# Patient Record
Sex: Female | Born: 2000 | Race: White | Hispanic: Yes | Smoking: Never smoker
Health system: Southern US, Community
[De-identification: ages and names within clinical notes are randomized; demographics above are authoritative.]

## PROBLEM LIST (undated history)

## (undated) DIAGNOSIS — I10 Essential (primary) hypertension: Secondary | ICD-10-CM

## (undated) HISTORY — PX: NO PAST SURGERIES: SHX2092

---
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
## 2021-05-14 ENCOUNTER — Inpatient Hospital Stay (HOSPITAL_COMMUNITY): Payer: Self-pay

## 2021-05-14 ENCOUNTER — Encounter (HOSPITAL_COMMUNITY): Payer: Self-pay | Admitting: Obstetrics and Gynecology

## 2021-05-14 ENCOUNTER — Encounter (HOSPITAL_COMMUNITY): Payer: Self-pay | Admitting: Family Medicine

## 2021-05-14 ENCOUNTER — Inpatient Hospital Stay (HOSPITAL_COMMUNITY)
Admission: AD | Admit: 2021-05-14 | Discharge: 2021-05-14 | Disposition: A | Discharge status: Home / Self Care | Payer: Self-pay | Attending: Obstetrics and Gynecology | Admitting: Obstetrics and Gynecology

## 2021-05-14 ENCOUNTER — Inpatient Hospital Stay (HOSPITAL_COMMUNITY)
Admission: AD | Admit: 2021-05-14 | Discharge: 2021-05-14 | Disposition: A | Discharge status: Home / Self Care | Payer: Self-pay | Attending: Family Medicine | Admitting: Family Medicine

## 2021-05-14 ENCOUNTER — Other Ambulatory Visit: Payer: Self-pay

## 2021-05-14 DIAGNOSIS — R109 Unspecified abdominal pain: Secondary | ICD-10-CM | POA: Insufficient documentation

## 2021-05-14 DIAGNOSIS — Z6790 Unspecified blood type, Rh positive: Secondary | ICD-10-CM | POA: Insufficient documentation

## 2021-05-14 DIAGNOSIS — O039 Complete or unspecified spontaneous abortion without complication: Secondary | ICD-10-CM

## 2021-05-14 DIAGNOSIS — O161 Unspecified maternal hypertension, first trimester: Secondary | ICD-10-CM | POA: Insufficient documentation

## 2021-05-14 DIAGNOSIS — O3680X0 Pregnancy with inconclusive fetal viability, not applicable or unspecified: Secondary | ICD-10-CM

## 2021-05-14 DIAGNOSIS — O209 Hemorrhage in early pregnancy, unspecified: Secondary | ICD-10-CM | POA: Insufficient documentation

## 2021-05-14 DIAGNOSIS — Z3A01 Less than 8 weeks gestation of pregnancy: Secondary | ICD-10-CM | POA: Insufficient documentation

## 2021-05-14 HISTORY — DX: Essential (primary) hypertension: I10

## 2021-05-14 LAB — CBC
HCT: 36.2 % (ref 36.0–46.0)
HCT: 37.3 % (ref 36.0–46.0)
Hemoglobin: 12 g/dL (ref 12.0–15.0)
Hemoglobin: 12.2 g/dL (ref 12.0–15.0)
MCH: 27.8 pg (ref 26.0–34.0)
MCH: 27.5 pg (ref 26.0–34.0)
MCHC: 33.1 g/dL (ref 30.0–36.0)
MCHC: 32.7 g/dL (ref 30.0–36.0)
MCV: 84 fL (ref 80.0–100.0)
MCV: 84 fL (ref 80.0–100.0)
Platelets: 223 10*3/uL (ref 150–400)
Platelets: 247 10*3/uL (ref 150–400)
RBC: 4.31 MIL/uL (ref 3.87–5.11)
RBC: 4.44 MIL/uL (ref 3.87–5.11)
RDW: 13.2 % (ref 11.5–15.5)
RDW: 13.2 % (ref 11.5–15.5)
WBC: 8.4 10*3/uL (ref 4.0–10.5)
WBC: 8.7 10*3/uL (ref 4.0–10.5)
nRBC: 0 % (ref 0.0–0.2)
nRBC: 0 % (ref 0.0–0.2)

## 2021-05-14 LAB — ABO/RH: ABO/RH(D): O POS

## 2021-05-14 LAB — HCG, QUANTITATIVE, PREGNANCY
hCG, Beta Chain, Quant, S: 6685 m[IU]/mL — ABNORMAL HIGH (ref <5)
hCG, Beta Chain, Quant, S: 5277 m[IU]/mL — ABNORMAL HIGH (ref <5)

## 2021-05-14 LAB — URINALYSIS, ROUTINE W REFLEX MICROSCOPIC
Bilirubin Urine: NEGATIVE
Glucose, UA: NEGATIVE mg/dL
Ketones, ur: NEGATIVE mg/dL
Nitrite: NEGATIVE
Protein, ur: NEGATIVE mg/dL
Specific Gravity, Urine: 1.013 (ref 1.005–1.030)
pH: 8 (ref 5.0–8.0)

## 2021-05-14 LAB — GC/CHLAMYDIA PROBE AMP (~~LOC~~) NOT AT ARMC
Chlamydia: NEGATIVE
Comment: NEGATIVE
Comment: NORMAL
Neisseria Gonorrhea: NEGATIVE

## 2021-05-14 LAB — HIV ANTIBODY (ROUTINE TESTING W REFLEX): HIV Screen 4th Generation wRfx: NONREACTIVE

## 2021-05-14 LAB — WET PREP, GENITAL
Sperm: NONE SEEN
Trich, Wet Prep: NONE SEEN
Yeast Wet Prep HPF POC: NONE SEEN

## 2021-05-14 NOTE — MAU Provider Note (Signed)
History     CSN: [id number]  Arrival date and time: 05/14/21 1458   Event Date/Time   First Provider Initiated Contact with Patient 05/14/21 1644      Chief Complaint  Patient presents with   Vaginal Bleeding   20 y.o. G1 @[redacted]w[redacted]d  presenting with increased VB. She was seen last night for VB. Reports increased bleeding about 2 hrs ago. She is also passing clots. Reports low abdominal cramping. Rates pain 5/10. Has not treated it.    OB History     Gravida  1   Para      Term      Preterm      AB      Living         SAB      IAB      Ectopic      Multiple      Live Births              Past Medical History:  Diagnosis Date   Hypertension     History reviewed. No pertinent surgical history.  Family History  Problem Relation Age of Onset   Diabetes Mother    Diabetes Father    Diabetes Maternal Grandmother    Diabetes Maternal Grandfather    Diabetes Paternal Grandmother    Diabetes Paternal Grandfather     Social History   Tobacco Use   Smoking status: Never   Smokeless tobacco: Never  Vaping Use   Vaping Use: Former   Substances: Nicotine  Substance Use Topics   Alcohol use: Never   Drug use: Never    Allergies: No Known Allergies  Medications Prior to Admission  Medication Sig Dispense Refill Last Dose   Prenatal Vit-Fe Fumarate-FA (PRENATAL MULTIVITAMIN) TABS tablet Take 1 tablet by mouth daily at 12 noon.       Review of Systems  Gastrointestinal:  Positive for abdominal pain.  Genitourinary:  Positive for vaginal bleeding.  Physical Exam   Blood pressure (!) 114/56, pulse 80, temperature 99.3 F (37.4 C), temperature source Oral, resp. rate 18, height 4\' 11"  (1.499 m), weight 78.3 kg, last menstrual period 03/28/2021, SpO2 99 %.  Physical Exam Vitals and nursing note reviewed. Exam conducted with a chaperone present.  Constitutional:      General: She is not in acute distress.    Appearance: Normal appearance.  HENT:      Head: Normocephalic and atraumatic.  Cardiovascular:     Rate and Rhythm: Normal rate.  Pulmonary:     Effort: Pulmonary effort is normal. No respiratory distress.  Genitourinary:    Comments: VE: small amt bloody discharge, cleared with 3 fox swabs, ?IUGS in vault, removed with ring forceps Musculoskeletal:        General: Normal range of motion.     Cervical back: Normal range of motion.  Skin:    General: Skin is warm and dry.  Neurological:     General: No focal deficit present.     Mental Status: She is alert and oriented to person, place, and time.  Psychiatric:        Mood and Affect: Mood normal.        Behavior: Behavior normal.   Results for orders placed or performed during the hospital encounter of 05/14/21 (from the past 24 hour(s))  CBC     Status: None   Collection Time: 05/14/21  5:08 PM  Result Value Ref Range   WBC 8.7 4.0 - 10.5 K/uL  RBC 4.44 3.87 - 5.11 MIL/uL   Hemoglobin 12.2 12.0 - 15.0 g/dL   HCT 92.4 26.8 - 34.1 %   MCV 84.0 80.0 - 100.0 fL   MCH 27.5 26.0 - 34.0 pg   MCHC 32.7 30.0 - 36.0 g/dL   RDW 96.2 22.9 - 79.8 %   Platelets 247 150 - 400 K/uL   nRBC 0.0 0.0 - 0.2 %   US OB Transvaginal  Result Date: 05/14/2021 CLINICAL DATA:  Worsening vaginal bleeding in 1st trimester pregnancy. EXAM: TRANSVAGINAL OB ULTRASOUND TECHNIQUE: Transvaginal ultrasound was performed for complete evaluation of the gestation as well as the maternal uterus, adnexal regions, and pelvic cul-de-sac. COMPARISON:  Prior today FINDINGS: Intrauterine gestational sac: None, intrauterine gestational sac seen on recent study earlier today is no longer visualized. Maternal uterus/adnexae: No fibroids identified. Right ovary is normal appearance. Left ovary is not directly visualized, however no adnexal mass or abnormal free fluid identified. IMPRESSION: Recently seen IUP is no longer visualized, consistent with spontaneous abortion. Electronically Signed   By: Danae Orleans M.D.    On: 05/14/2021 17:52   US OB LESS THAN 14 WEEKS WITH OB TRANSVAGINAL  Result Date: 05/14/2021 CLINICAL DATA:  Bleeding in pregnancy. Pregnancy of unknown location EXAM: OBSTETRIC <14 WK [place] AND TRANSVAGINAL OB US TECHNIQUE: Both transabdominal and transvaginal ultrasound examinations were performed for complete evaluation of the gestation as well as the maternal uterus, adnexal regions, and pelvic cul-de-sac. Transvaginal technique was performed to assess early pregnancy. COMPARISON:  None. FINDINGS: Intrauterine gestational sac: Single Yolk sac:  Visualized. Embryo:  Visualized. Cardiac Activity: Visualized. Heart Rate: 103 beats per minute- an unremarkable rate for early gestation CRL:  4.3 mm   6 w   1 d                  [place] EDC: 01/06/2022 Subchorionic hemorrhage:  None visualized. Maternal uterus/adnexae: Normal. IMPRESSION: Single living intrauterine pregnancy measuring 6 weeks 1 day. Electronically Signed   By: Marnee Spring M.D.   On: 05/14/2021 05:04    MAU Course  Procedures  MDM Declines pain meds. Labs and [place] ordered and reviewed. SAB confirmed by [place]. Pt informed, condolences given. Bleeding remains minimal. POCs to path. Stable for discharge home.   Assessment and Plan   1. SAB (spontaneous abortion)   2. Blood type, Rh positive    Discharge home Follow up at [facility] in 2 weeks- message sent Pelvic rest Return precautions Tylenol/Ibuprofen prn  Allergies as of 05/14/2021   No Known Allergies      Medication List     TAKE these medications    prenatal multivitamin Tabs tablet Take 1 tablet by mouth daily at 12 noon.       Donette Larry, CNM 05/14/2021, 6:28 PM

## 2021-05-14 NOTE — MAU Provider Note (Signed)
Chief Complaint: Vaginal Bleeding   Event Date/Time   First Provider Initiated Contact with Patient 05/14/21 0305        SUBJECTIVE HPI: Virginia Burke is a 20 y.o. G1P0 at [redacted]w[redacted]d by LMP who presents to maternity admissions reporting light bleeding and no cramping.  Has been to HD for pregnancy test but has not had visits yet. . She denies vaginal itching/burning, urinary symptoms, h/a, dizziness, n/v, or fever/chills.    Vaginal Bleeding The patient's primary symptoms include vaginal bleeding. The patient's pertinent negatives include no genital itching, genital lesions, genital odor or pelvic pain. This is a new problem. The current episode started today. The problem has been gradually improving. The patient is experiencing no pain. She is pregnant. Pertinent negatives include no abdominal pain, back pain, chills, constipation, diarrhea, dysuria, fever or frequency. The vaginal discharge was bloody. The vaginal bleeding is spotting. She has not been passing clots. She has not been passing tissue. Nothing aggravates the symptoms. She has tried nothing for the symptoms.   RN Note: Pt c/o spotting today. Recent positive pregnancy test at health department, LMP 03/28/21. No pain  Past Medical History:  Diagnosis Date   Hypertension    History reviewed. No pertinent surgical history. Social History   Socioeconomic History   Marital status: Significant Other    Spouse name: Not on file   Number of children: Not on file   Years of education: Not on file   Highest education level: Not on file  Occupational History   Not on file  Tobacco Use   Smoking status: Never   Smokeless tobacco: Never  Vaping Use   Vaping Use: Former   Substances: Nicotine  Substance and Sexual Activity   Alcohol use: Never   Drug use: Never   Sexual activity: Not Currently  Other Topics Concern   Not on file  Social History Narrative   Not on file   Social Determinants of Health   Financial  Resource Strain: Not on file  Food Insecurity: Not on file  Transportation Needs: Not on file  Physical Activity: Not on file  Stress: Not on file  Social Connections: Not on file  Intimate Partner Violence: Not on file   No current facility-administered medications on file prior to encounter.   Current Outpatient Medications on File Prior to Encounter  Medication Sig Dispense Refill   Prenatal Vit-Fe Fumarate-FA (PRENATAL MULTIVITAMIN) TABS tablet Take 1 tablet by mouth daily at 12 noon.     acetaminophen-codeine 120-12 MG/5ML suspension Take 5 mLs by mouth every 6 (six) hours as needed for pain.     amoxicillin (AMOXIL) 250 MG/5ML suspension Take by mouth 3 (three) times daily.     No Known Allergies  I have reviewed patient's Past Medical Hx, Surgical Hx, Family Hx, Social Hx, medications and allergies.   ROS:  Review of Systems  Constitutional:  Negative for chills and fever.  Gastrointestinal:  Negative for abdominal pain, constipation and diarrhea.  Genitourinary:  Positive for vaginal bleeding. Negative for dysuria, frequency and pelvic pain.  Musculoskeletal:  Negative for back pain.  Review of Systems  Other systems negative   Physical Exam  Physical Exam Patient Vitals for the past 24 hrs:  BP Temp Temp src Pulse Resp SpO2 Height Weight  05/14/21 0215 112/60 98.2 F (36.8 C) Oral 77 16 99 % 4\' 11"  (1.499 m) 79.2 kg   Constitutional: Well-developed, well-nourished female in no acute distress.  Cardiovascular: normal rate Respiratory: normal effort  GI: Abd soft, non-tender. Pos BS x 4 MS: Extremities nontender, no edema, normal ROM Neurologic: Alert and oriented x 4.  GU: Neg CVAT.  PELVIC EXAM: Cervix pink, visually closed, without lesion, scant white creamy discharge, vaginal walls and external genitalia normal Bimanual exam: Cervix 0/long/high, firm, anterior, neg CMT, uterus nontender, slightly enlarged, adnexa without tenderness, enlargement, or  mass Chaperone present for exam   LAB RESULTS Results for orders placed or performed during the hospital encounter of 05/14/21 (from the past 24 hour(s))  CBC     Status: None   Collection Time: 05/14/21  2:46 AM  Result Value Ref Range   WBC 8.4 4.0 - 10.5 K/uL   RBC 4.31 3.87 - 5.11 MIL/uL   Hemoglobin 12.0 12.0 - 15.0 g/dL   HCT 81.8 29.9 - 37.1 %   MCV 84.0 80.0 - 100.0 fL   MCH 27.8 26.0 - 34.0 pg   MCHC 33.1 30.0 - 36.0 g/dL   RDW 69.6 78.9 - 38.1 %   Platelets 223 150 - 400 K/uL   nRBC 0.0 0.0 - 0.2 %  hCG, quantitative, pregnancy     Status: Abnormal   Collection Time: 05/14/21  2:46 AM  Result Value Ref Range   hCG, Beta Chain, Quant, S 6,685 (H) <5 mIU/mL  Urinalysis, Routine w reflex microscopic Urine, Clean Catch     Status: Abnormal   Collection Time: 05/14/21  3:08 AM  Result Value Ref Range   Color, Urine AMBER (A) YELLOW   APPearance CLOUDY (A) CLEAR   Specific Gravity, Urine 1.013 1.005 - 1.030   pH 8.0 5.0 - 8.0   Glucose, UA NEGATIVE NEGATIVE mg/dL   Hgb urine dipstick MODERATE (A) NEGATIVE   Bilirubin Urine NEGATIVE NEGATIVE   Ketones, ur NEGATIVE NEGATIVE mg/dL   Protein, ur NEGATIVE NEGATIVE mg/dL   Nitrite NEGATIVE NEGATIVE   Leukocytes,Ua SMALL (A) NEGATIVE   RBC / HPF 0-5 0 - 5 RBC/hpf   WBC, UA 21-50 0 - 5 WBC/hpf   Bacteria, UA RARE (A) NONE SEEN   Squamous Epithelial / LPF 6-10 0 - 5   Amorphous Crystal PRESENT   Wet prep, genital     Status: Abnormal   Collection Time: 05/14/21  3:18 AM   Specimen: Vaginal  Result Value Ref Range   Yeast Wet Prep HPF POC NONE SEEN NONE SEEN   Trich, Wet Prep NONE SEEN NONE SEEN   Clue Cells Wet Prep HPF POC PRESENT (A) NONE SEEN   WBC, Wet Prep HPF POC MANY (A) NONE SEEN   Sperm NONE SEEN   ABO/Rh     Status: None   Collection Time: 05/14/21  3:25 AM  Result Value Ref Range   ABO/RH(D)      O POS Performed at [facility], [address] [place], [place] [address]      IMAGING US OB  LESS THAN 14 WEEKS WITH OB TRANSVAGINAL  Result Date: 05/14/2021 CLINICAL DATA:  Bleeding in pregnancy. Pregnancy of unknown location EXAM: OBSTETRIC <14 WK [place] AND TRANSVAGINAL OB US TECHNIQUE: Both transabdominal and transvaginal ultrasound examinations were performed for complete evaluation of the gestation as well as the maternal uterus, adnexal regions, and pelvic cul-de-sac. Transvaginal technique was performed to assess early pregnancy. COMPARISON:  None. FINDINGS: Intrauterine gestational sac: Single Yolk sac:  Visualized. Embryo:  Visualized. Cardiac Activity: Visualized. Heart Rate: 103 beats per minute- an unremarkable rate for early gestation CRL:  4.3 mm   6 w  1 d                  [place] EDC: 01/06/2022 Subchorionic hemorrhage:  None visualized. Maternal uterus/adnexae: Normal. IMPRESSION: Single living intrauterine pregnancy measuring 6 weeks 1 day. Electronically Signed   By: Marnee Spring M.D.   On: 05/14/2021 05:04     MAU Management/MDM: Ordered usual first trimester r/o ectopic labs.   Pelvic exam and cultures done Will check baseline Ultrasound to rule out ectopic.  This bleeding/pain can represent a normal pregnancy with bleeding, spontaneous abortion or even an ectopic which can be life-threatening.  The process as listed above helps to determine which of these is present.  Reviewed results Discussed seeing live fetus with no obvious source of bleeding, but this is likely implantation or small [facility]  Recommend pelvic rest for 2 wks   ASSESSMENT Pregnancy of unknown location Bleeding in first trimester Single live IUP seen on [place]  PLAN Discharge home List of OB providers given  Pt stable at time of discharge. Encouraged to return here if she develops worsening of symptoms, increase in pain, fever, or other concerning symptoms.    Wynelle Bourgeois CNM, MSN Certified Nurse-Midwife 05/14/2021  3:05 AM

## 2021-05-14 NOTE — MAU Note (Addendum)
Pt came in be EMS due to increase in bleeding. Came yesterday(early this morning) for spotting, now is heavier and passing blood clots. Cramping in lower abd. Does not have pad on "ambulance came, so she couldn't go change, underwear is full of blood". (Small amt per picture)

## 2021-05-14 NOTE — MAU Note (Signed)
Pt c/o spotting today. Recent positive pregnancy test at health department, LMP 03/28/21. No pain

## 2021-05-14 NOTE — Discharge Instructions (Signed)
[facility] Prov[facility]   [facility]             [address], [place], [place] [address] [phone]  [facility]                                                             [address], [address], [place], [place], [address] [phone]  [facility]                                    [address], [address], [place], [place], [address] [phone]  [facility] [place] [address], [place], [place], [address] [phone]  [facility]                                 [address], [place], [place], [address] [phone]  [facility]                                    [address], [place], [place], [address] [phone]  [facility] [address], [address], [place], [place], [address]                              [facility] [address], [place], , [address] [phone]  [facility]         Phone: [phone]  [facility]      Phone: [phone]   [facility]      Phone: [phone]  [facility]         Phone: [phone]   [facility]    Phone: [phone]  [facility]      Phone: [phone]  [facility]     Phone: [phone]  [facility]      Phone: [phone]  

## 2021-05-15 ENCOUNTER — Telehealth: Payer: Self-pay | Admitting: Radiology

## 2021-05-15 NOTE — Telephone Encounter (Signed)
Called patient to schedule appointment at [facility] for SAB in two weeks from MAU visit 05/14/21. Unable to leave voicemail.

## 2021-05-18 LAB — SURGICAL PATHOLOGY

## 2021-06-07 ENCOUNTER — Ambulatory Visit: Payer: Self-pay | Admitting: Obstetrics & Gynecology

## 2022-03-17 IMAGING — US US OB TRANSVAGINAL
1 series · 15 of 24 positions shown · non-contrast
Comparison: Prior today

CLINICAL DATA: Worsening vaginal bleeding in 1st trimester
pregnancy.

EXAM:
TRANSVAGINAL OB ULTRASOUND
TECHNIQUE: Transvaginal ultrasound was performed for complete evaluation of the
gestation as well as the maternal uterus, adnexal regions, and
pelvic cul-de-sac.

[Series 1: us ob transvaginal · 24 acquisitions, 15 frames shown]
[im 1/24]
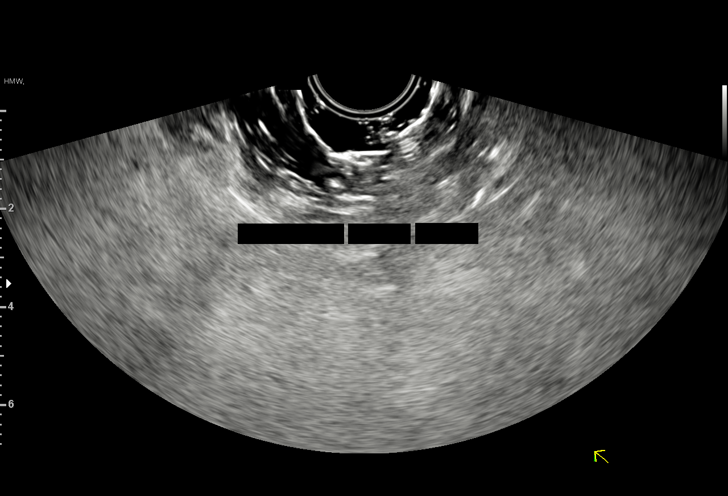
[im 3/24]
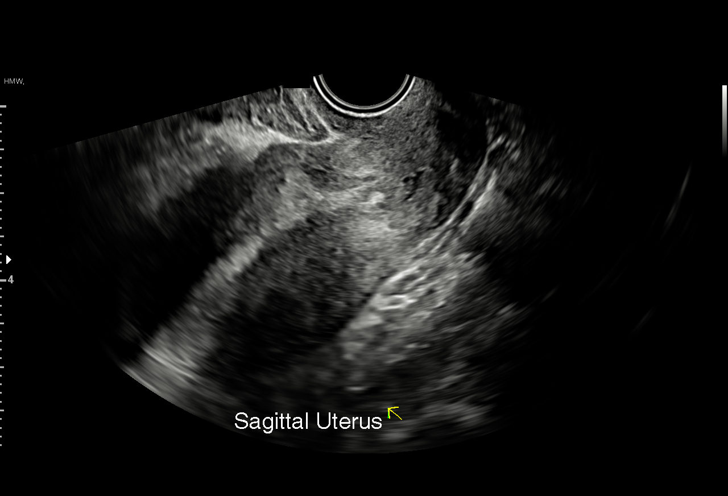
[im 5/24]
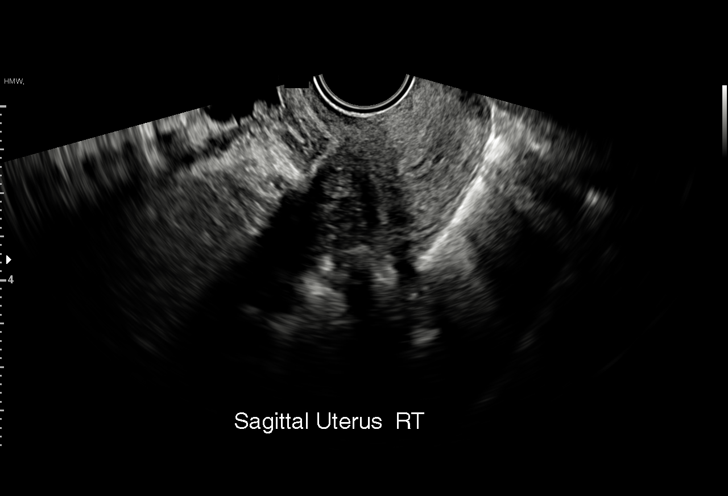
[im 6/24]
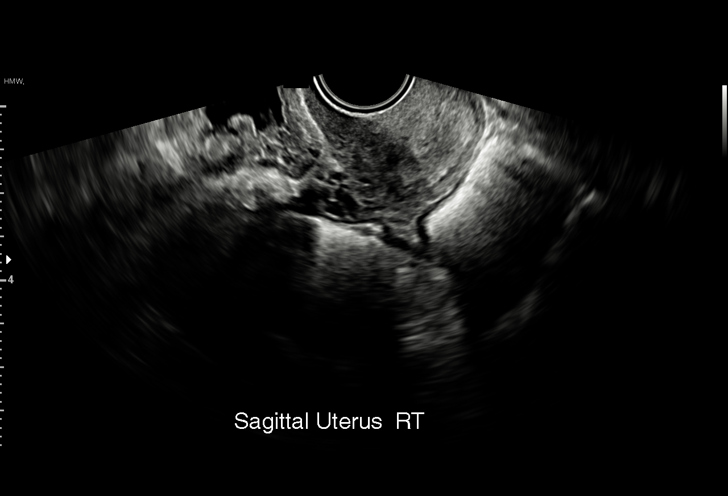
[im 8/24]
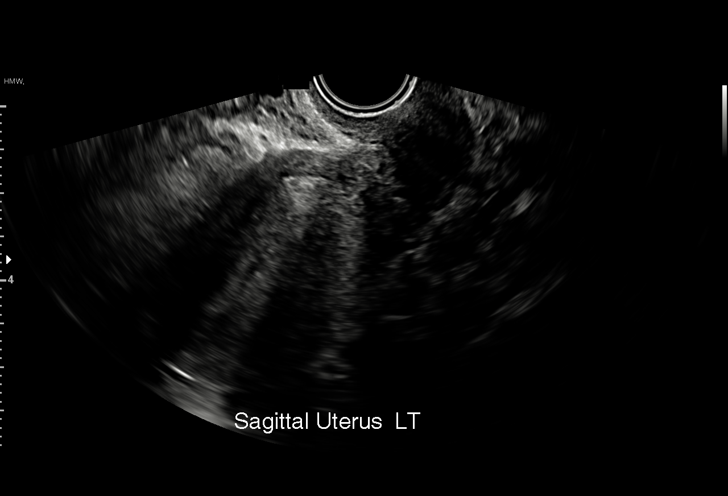
[im 9/24]
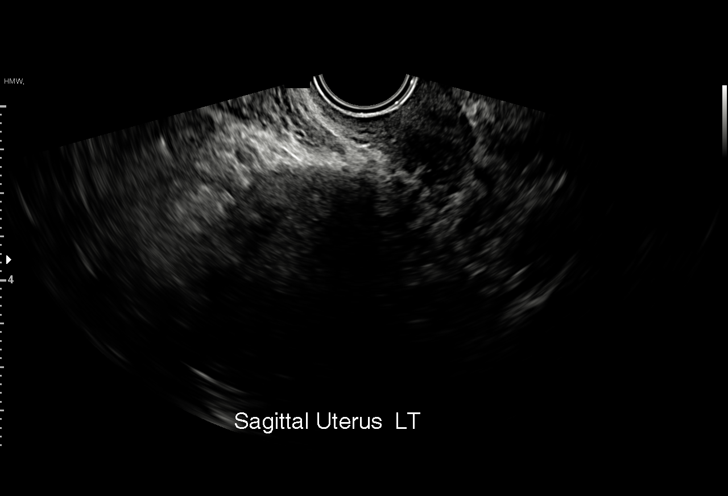
[im 11/24]
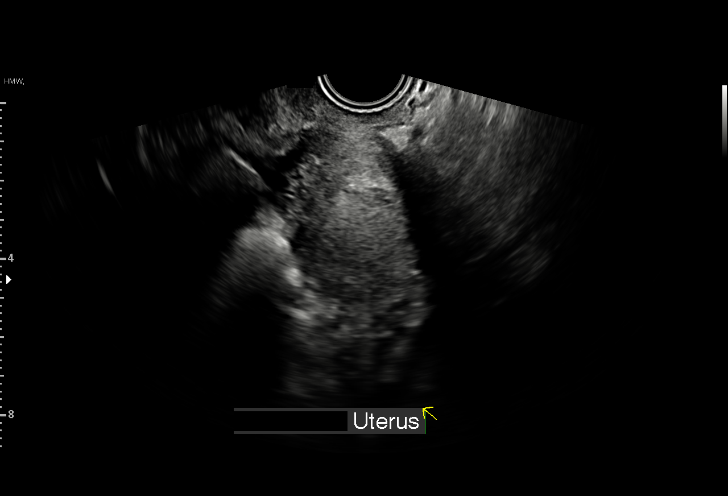
[im 13/24]
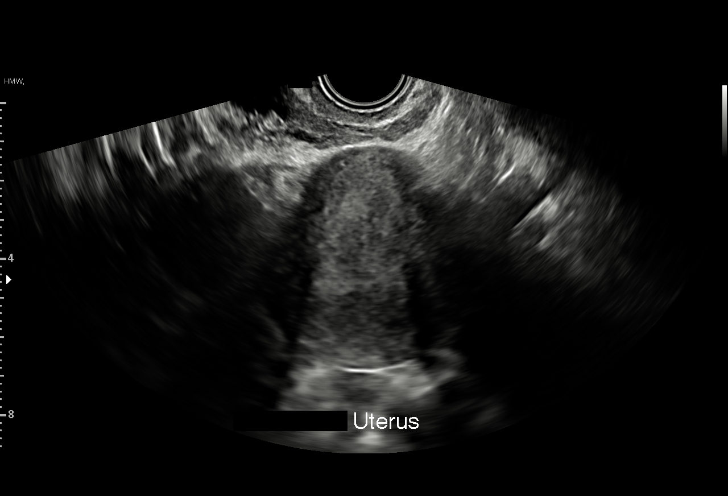
[im 14/24]
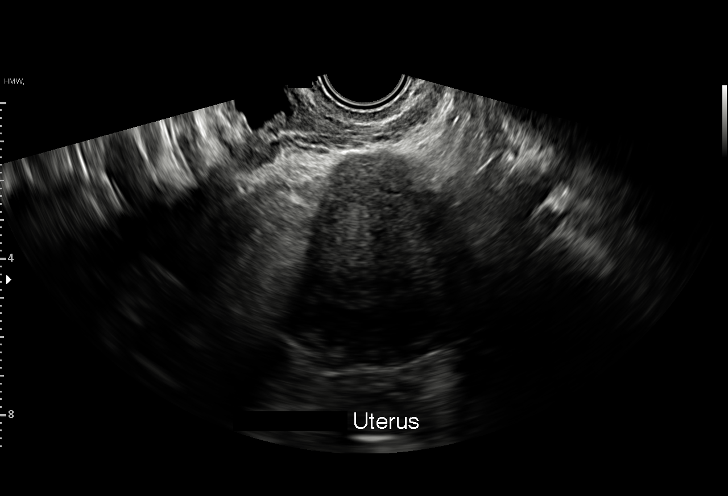
[im 16/24]
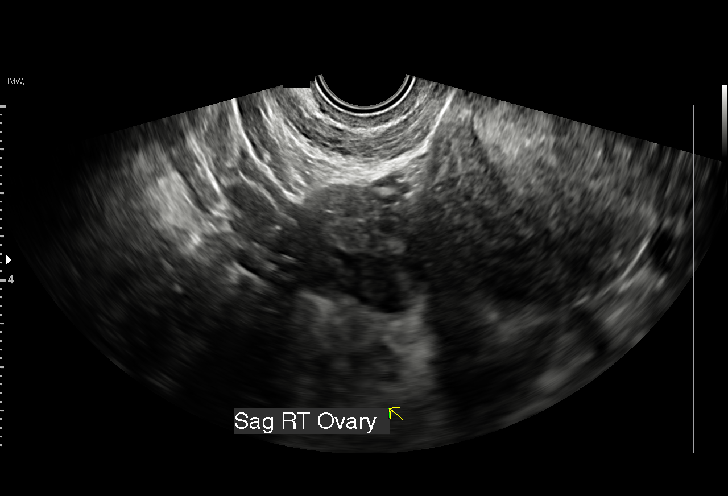
[im 17/24]
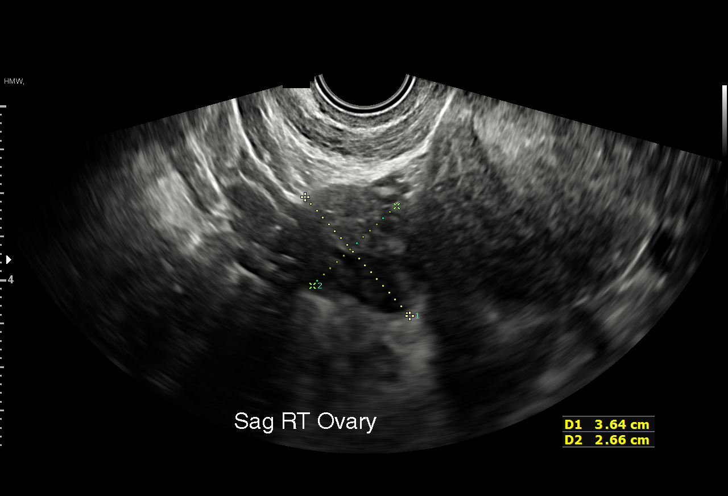
[im 19/24]
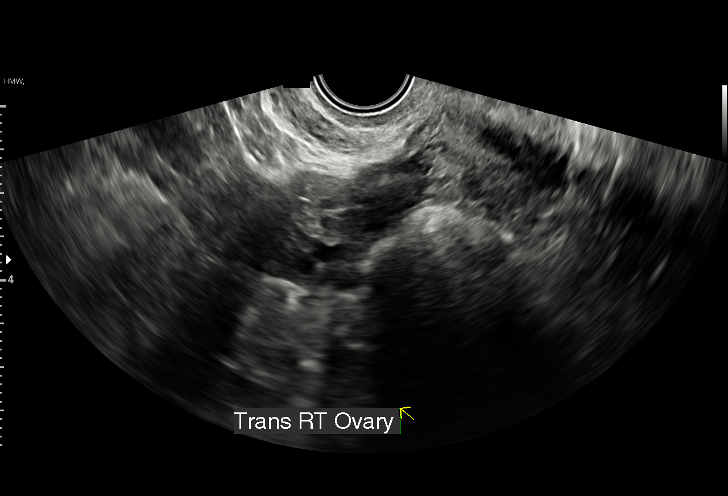
[im 21/24]
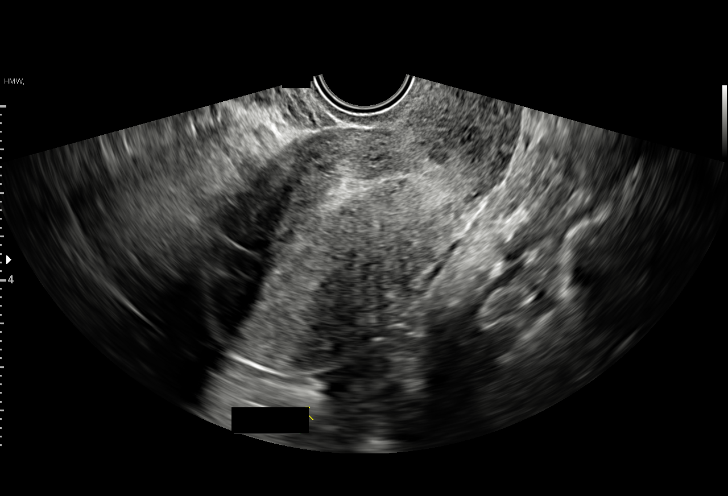
[im 22/24]
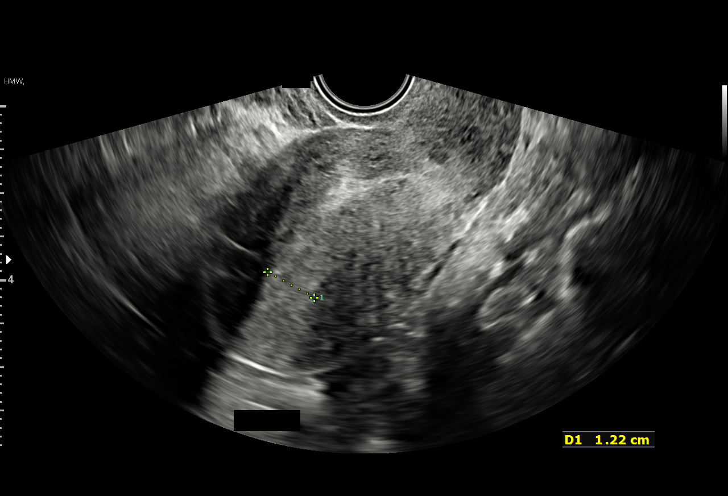
[im 24/24]
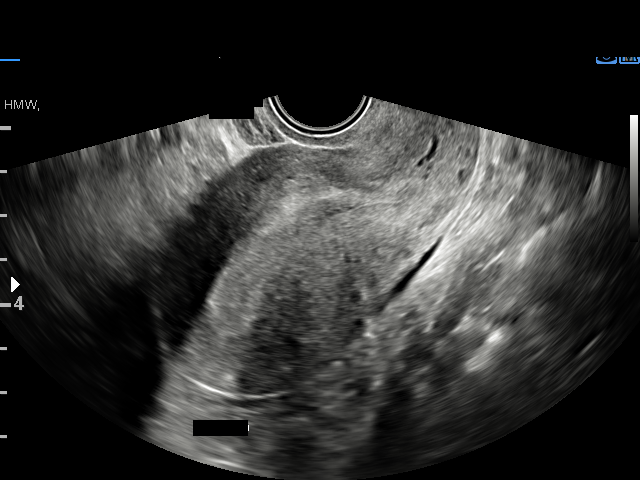

[15 of 24 positions shown; findings below may reference images not displayed]

FINDINGS: Intrauterine gestational sac: None, intrauterine gestational sac
seen on recent study earlier today is no longer visualized.

Maternal uterus/adnexae: No fibroids identified. Right ovary is
normal appearance. Left ovary is not directly visualized, however no
adnexal mass or abnormal free fluid identified.
IMPRESSION: Recently seen IUP is no longer visualized, consistent with
spontaneous abortion.

## 2022-03-17 IMAGING — US US OB < 14 WEEKS - US OB TV
1 series · 15 of 28 positions shown · non-contrast
Comparison: None.

CLINICAL DATA: Bleeding in pregnancy. Pregnancy of unknown location

EXAM:
OBSTETRIC <14 WK US AND TRANSVAGINAL OB US
TECHNIQUE: Both transabdominal and transvaginal ultrasound examinations were
performed for complete evaluation of the gestation as well as the
maternal uterus, adnexal regions, and pelvic cul-de-sac.
Transvaginal technique was performed to assess early pregnancy.

[Series 1: us ob < 14 weeks - us ob tv · 15 of 64 slices shown]
[im 1/64]
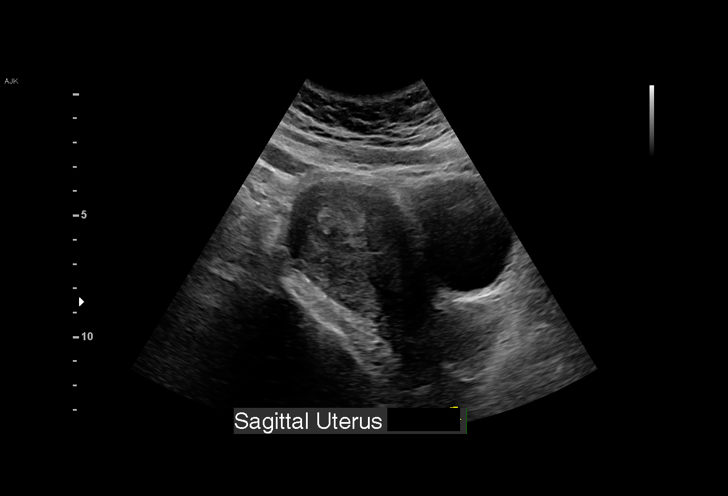
[im 5/64]
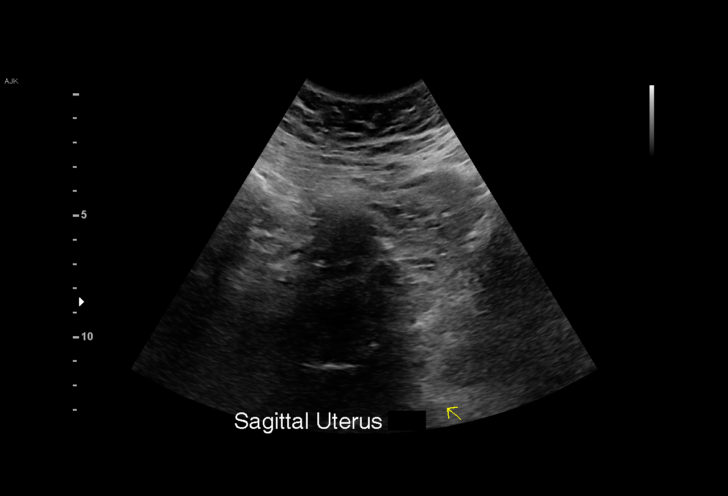
[im 10/64]
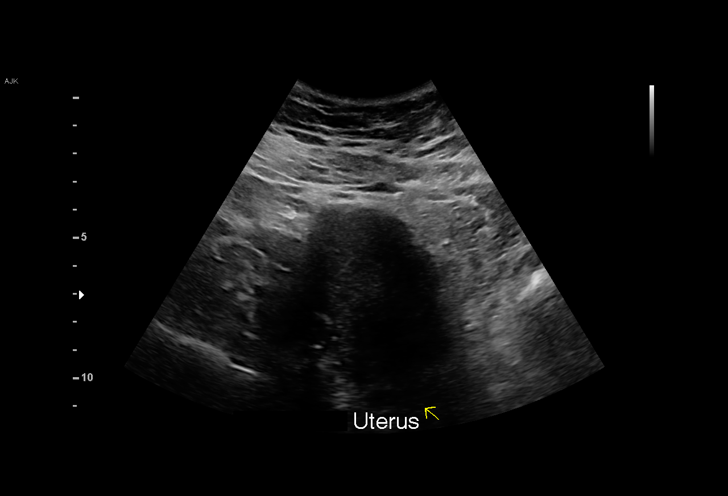
[im 15/64]
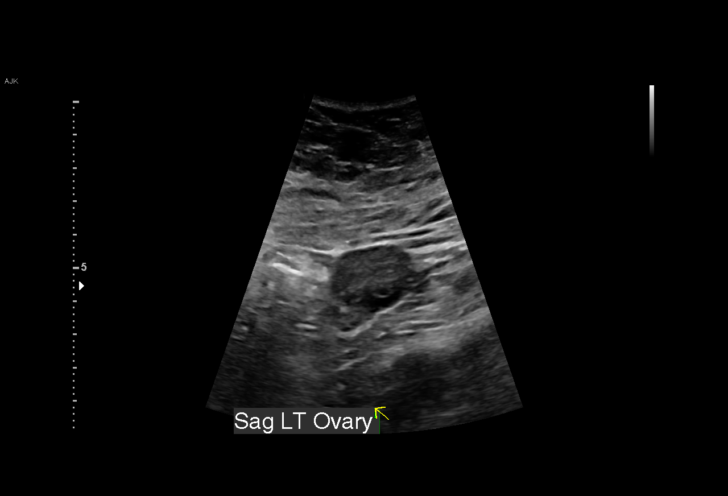
[im 19/64]
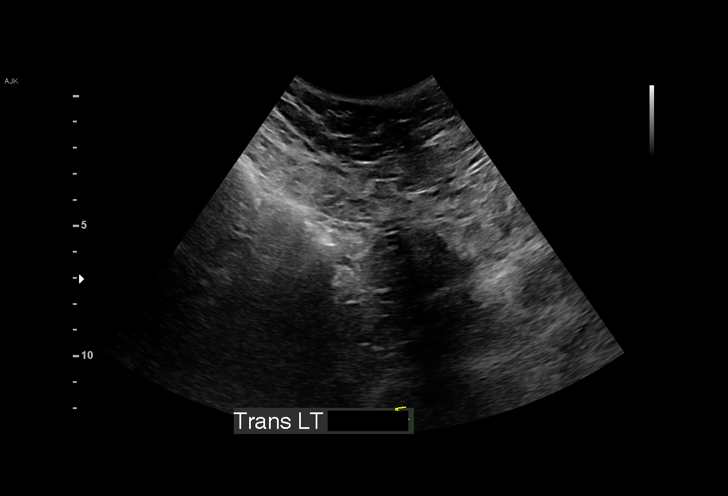
[im 24/64]
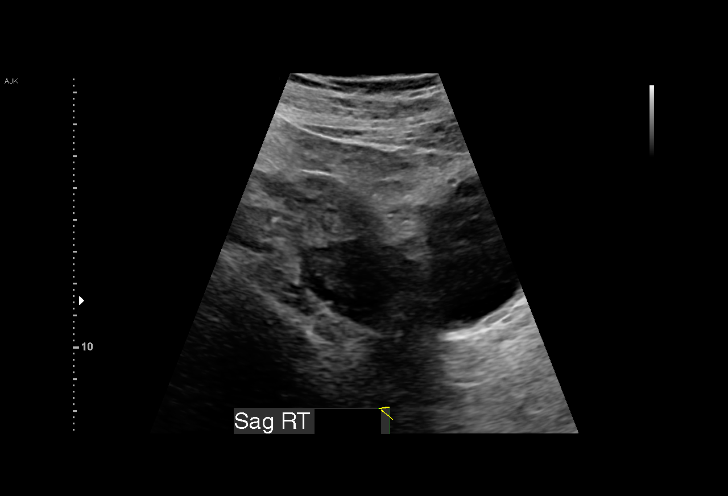
[im 29/64]
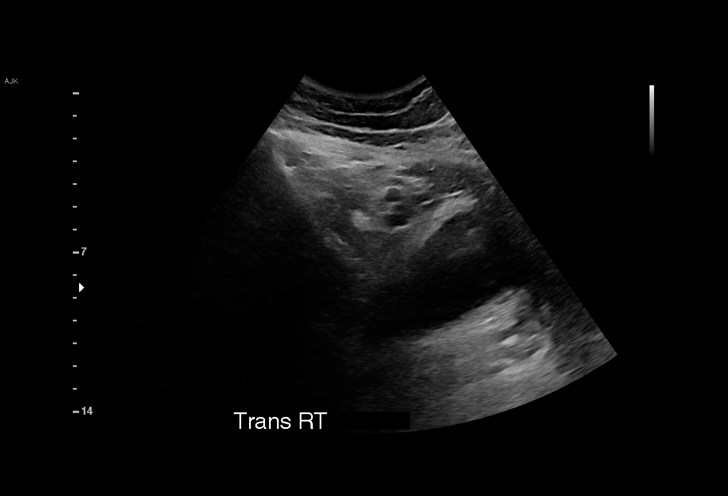
[im 33/64]
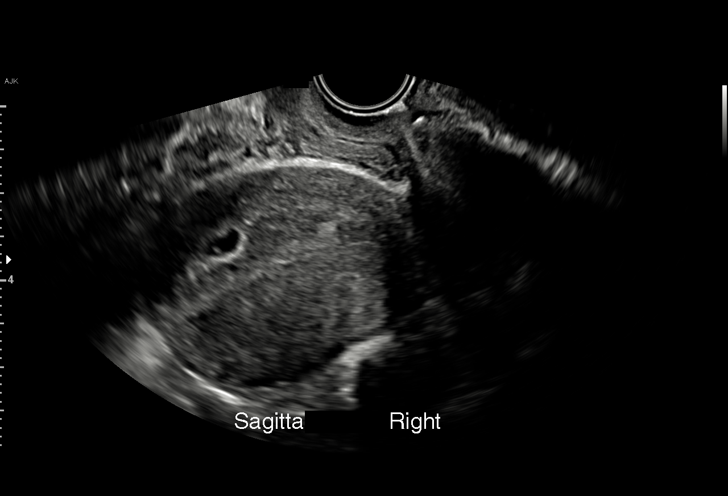
[im 36/64]
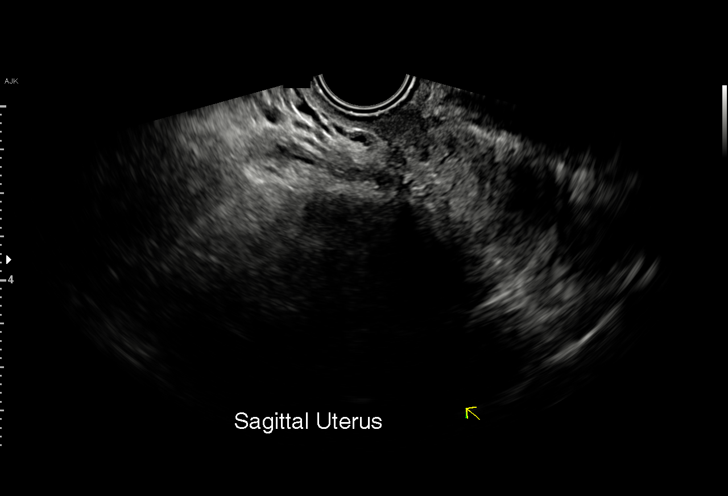
[im 40/64]
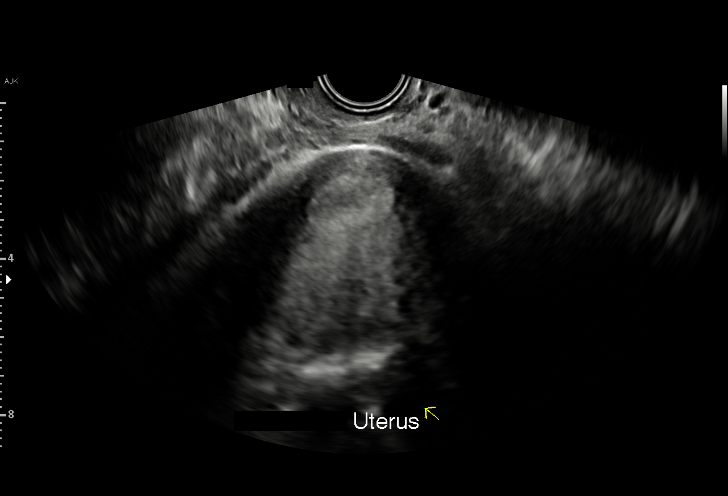
[im 45/64]
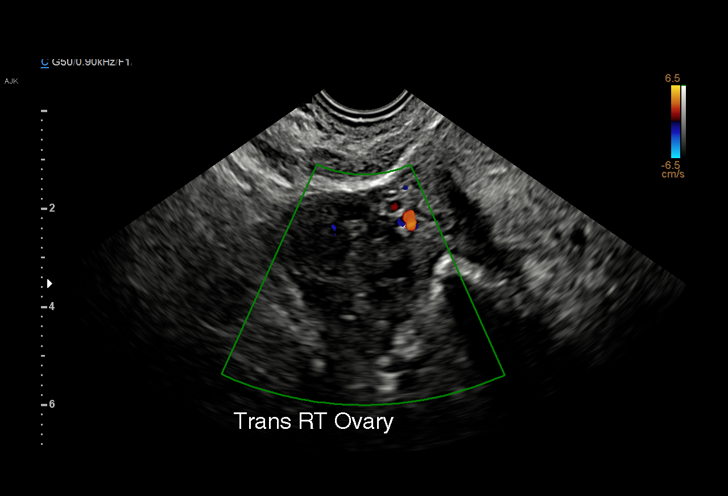
[im 50/64]
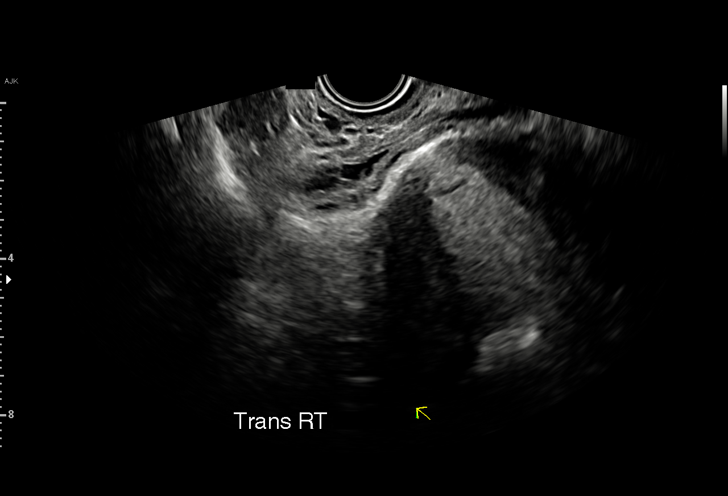
[im 54/64]
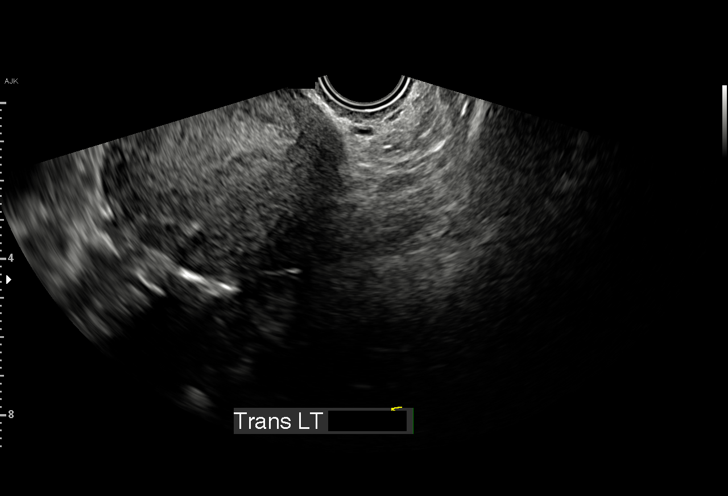
[im 59/64]
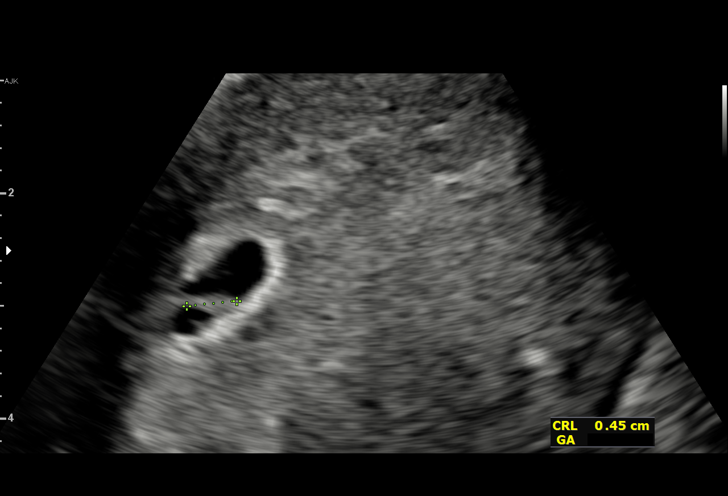
[im 64/64]
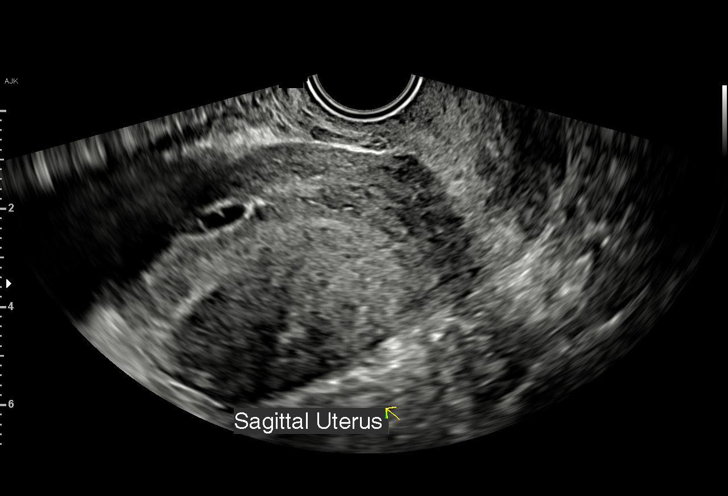

[15 of 28 positions shown; findings below may reference images not displayed]

FINDINGS: Intrauterine gestational sac: Single

Yolk sac:  Visualized.

Embryo:  Visualized.

Cardiac Activity: Visualized.

Heart Rate: 103 beats per minute- an unremarkable rate for early
gestation

CRL:  4.3 mm   6 w   1 d                  US EDC: 01/06/2022

Subchorionic hemorrhage:  None visualized.

Maternal uterus/adnexae: Normal.
IMPRESSION: Single living intrauterine pregnancy measuring 6 weeks 1 day.

## 2022-09-08 ENCOUNTER — Encounter (HOSPITAL_COMMUNITY): Payer: Self-pay

## 2022-09-08 ENCOUNTER — Inpatient Hospital Stay (HOSPITAL_COMMUNITY): Payer: Self-pay

## 2022-09-08 ENCOUNTER — Inpatient Hospital Stay (HOSPITAL_COMMUNITY)
Admission: AD | Admit: 2022-09-08 | Discharge: 2022-09-08 | Disposition: A | Discharge status: Home / Self Care | Payer: Self-pay | Attending: Obstetrics & Gynecology | Admitting: Obstetrics & Gynecology

## 2022-09-08 DIAGNOSIS — O209 Hemorrhage in early pregnancy, unspecified: Secondary | ICD-10-CM | POA: Insufficient documentation

## 2022-09-08 DIAGNOSIS — O021 Missed abortion: Secondary | ICD-10-CM

## 2022-09-08 DIAGNOSIS — Z3A10 10 weeks gestation of pregnancy: Secondary | ICD-10-CM | POA: Insufficient documentation

## 2022-09-08 LAB — CBC
HCT: 37.5 % (ref 36.0–46.0)
Hemoglobin: 12.3 g/dL (ref 12.0–15.0)
MCH: 27.1 pg (ref 26.0–34.0)
MCHC: 32.8 g/dL (ref 30.0–36.0)
MCV: 82.6 fL (ref 80.0–100.0)
Platelets: 241 10*3/uL (ref 150–400)
RBC: 4.54 MIL/uL (ref 3.87–5.11)
RDW: 13.1 % (ref 11.5–15.5)
WBC: 9.9 10*3/uL (ref 4.0–10.5)
nRBC: 0 % (ref 0.0–0.2)

## 2022-09-08 LAB — COMPREHENSIVE METABOLIC PANEL
ALT: 18 U/L (ref 0–44)
AST: 16 U/L (ref 15–41)
Albumin: 3.6 g/dL (ref 3.5–5.0)
Alkaline Phosphatase: 70 U/L (ref 38–126)
Anion gap: 8 (ref 5–15)
BUN: 5 mg/dL — ABNORMAL LOW (ref 6–20)
CO2: 22 mmol/L (ref 22–32)
Calcium: 9.3 mg/dL (ref 8.9–10.3)
Chloride: 108 mmol/L (ref 98–111)
Creatinine, Ser: 0.59 mg/dL (ref 0.44–1.00)
GFR, Estimated: >60 mL/min (ref >60)
Glucose, Bld: 101 mg/dL — ABNORMAL HIGH (ref 70–99)
Potassium: 4 mmol/L (ref 3.5–5.1)
Sodium: 138 mmol/L (ref 135–145)
Total Bilirubin: 0.3 mg/dL (ref 0.3–1.2)
Total Protein: 7.2 g/dL (ref 6.5–8.1)

## 2022-09-08 LAB — WET PREP, GENITAL
Sperm: NONE SEEN
Trich, Wet Prep: NONE SEEN
WBC, Wet Prep HPF POC: >=10 — AB (ref <10)
Yeast Wet Prep HPF POC: NONE SEEN

## 2022-09-08 LAB — GC/CHLAMYDIA PROBE AMP (~~LOC~~) NOT AT ARMC
Chlamydia: NEGATIVE
Comment: NEGATIVE
Comment: NORMAL
Neisseria Gonorrhea: NEGATIVE

## 2022-09-08 LAB — POCT PREGNANCY, URINE: Preg Test, Ur: POSITIVE — AB

## 2022-09-08 LAB — HCG, QUANTITATIVE, PREGNANCY: hCG, Beta Chain, Quant, S: 25633 m[IU]/mL — ABNORMAL HIGH (ref <5)

## 2022-09-08 NOTE — Discharge Instructions (Addendum)
There are three options for managing a miscarriage: 1) Expectant Management: This is where you wait for 2 weeks to see if the body passes the pregnancy on its own 2) Cytotec (Misoprostol): this is a medication that causes cramping of the uterus and can help passage of the pregnancy.  3) Dilation and curettage: this is a surgical procedure to remove the pregnancy from the uterus   We will see you in 2 weeks to confirm passage of the pregnancy or to help you with passage of your pregnancy. You were given a prescription for 3 medications. Two were sent to your pharmacy (Ibuprofen which is for pain and zofran which is for nausea). You were given a paper prescription for Percocet which is a strong pain medication you can use for severe cramping.  If you decide you would like to have Cytotec please call the office and we will send this to the pharmacy for you.    We will see you in 2 weeks to confirm passage of the pregnancy or to help you with passage of your pregnancy. You were given a prescription for 3 medications. Two were sent to your pharmacy (Ibuprofen which is for pain and Zofran which is for nausea). You were also sent in a limited prescription for Percocet for severe pain.  In case you decide to proceed with Cytotec usage that prescription has been sent as well.  If you have any problems with obtaining the medication, at the pharmacy, please call the office.   You need to call or go to the emergency room for: -bleeding that fills up 1 pad per hour -Severe abdominal pain -Dizziness/lightheadedness -passing out Or any medical concern

## 2022-09-08 NOTE — MAU Note (Signed)
.  Virginia Burke is a 21 y.o. at [redacted]w[redacted]d here in MAU reporting: intermittent light pink VB spotting with wiping for last 3 days. Pt denies ABD pain. Pt currently not wearing a pad. Pt denies PNC yet, first appt Tuesday. Pt denies LOF or clotting.  No recent intercourse LMP: 06/30/2022 Onset of complaint: 3 days Pain score: 0/10 Vitals:   09/08/22 0213  BP: 119/61  Pulse: 88  Resp: 18  Temp: 98.8 F (37.1 C)  SpO2: 100%     FHT:RN attempted to assess Lab orders placed from triage:

## 2022-09-08 NOTE — MAU Provider Note (Signed)
History     CSN: [id number]  Arrival date and time: 09/08/22 0048   Event Date/Time   First Provider Initiated Contact with Patient 09/08/22 [phone]      Chief Complaint  Patient presents with   Vaginal Bleeding   HPI Virginia Burke is a 21 y.o. G2P0 at [redacted]w[redacted]d by Unsure Jun 30, 2022 who plans to receive care at [facility] .  She presents today for Vaginal Bleeding.  She reports some occasional light pink spotting, with wiping, for the past 3 days. She denies discharge prior to the spotting. She states she notices the spotting with ambulation/activity, but when she stays in bed it resides.    OB History     Gravida  2   Para      Term      Preterm      AB      Living         SAB      IAB      Ectopic      Multiple      Live Births              Past Medical History:  Diagnosis Date   Hypertension     No past surgical history on file.  Family History  Problem Relation Age of Onset   Diabetes Mother    Diabetes Father    Diabetes Maternal Grandmother    Diabetes Maternal Grandfather    Diabetes Paternal Grandmother    Diabetes Paternal Grandfather     Social History   Tobacco Use   Smoking status: Never   Smokeless tobacco: Never  Vaping Use   Vaping Use: Former   Substances: Nicotine  Substance Use Topics   Alcohol use: Never   Drug use: Never    Allergies: No Known Allergies  Medications Prior to Admission  Medication Sig Dispense Refill Last Dose   Prenatal Vit-Fe Fumarate-FA (PRENATAL MULTIVITAMIN) TABS tablet Take 1 tablet by mouth daily at 12 noon.       Review of Systems  Constitutional:  Negative for chills and fever.  Gastrointestinal:  Positive for constipation (2 Days ago, hard to pass) and nausea. Negative for abdominal pain, diarrhea and vomiting.  Genitourinary:  Positive for vaginal bleeding. Negative for difficulty urinating, dysuria and vaginal discharge.  Neurological:  Positive for headaches (Left Side 3/10).  Negative for dizziness and light-headedness.   Physical Exam   Blood pressure 119/61, pulse 88, temperature 98.8 F (37.1 C), temperature source Oral, resp. rate 18, height 4\' 9"  (1.448 m), weight 78.6 kg, last menstrual period 06/30/2022, SpO2 100 %, unknown if currently breastfeeding.  Physical Exam Vitals reviewed.  Constitutional:      Appearance: Normal appearance.  HENT:     Head: Normocephalic and atraumatic.  Eyes:     Conjunctiva/sclera: Conjunctivae normal.  Cardiovascular:     Rate and Rhythm: Normal rate.  Pulmonary:     Effort: Pulmonary effort is normal. No respiratory distress.  Abdominal:     General: Bowel sounds are normal.  Musculoskeletal:     Cervical back: Normal range of motion.  Skin:    General: Skin is warm and dry.  Neurological:     Mental Status: She is alert and oriented to person, place, and time.  Psychiatric:        Mood and Affect: Mood normal.        Behavior: Behavior normal.     MAU Course  Procedures Results for orders placed  or performed during the hospital encounter of 09/08/22 (from the past 24 hour(s))  Pregnancy, urine POC     Status: Abnormal   Collection Time: 09/08/22  1:22 AM  Result Value Ref Range   Preg Test, Ur POSITIVE (A) NEGATIVE  CBC     Status: None   Collection Time: 09/08/22  2:08 AM  Result Value Ref Range   WBC 9.9 4.0 - 10.5 K/uL   RBC 4.54 3.87 - 5.11 MIL/uL   Hemoglobin 12.3 12.0 - 15.0 g/dL   HCT 01.0 27.2 - 53.6 %   MCV 82.6 80.0 - 100.0 fL   MCH 27.1 26.0 - 34.0 pg   MCHC 32.8 30.0 - 36.0 g/dL   RDW 64.4 03.4 - 74.2 %   Platelets 241 150 - 400 K/uL   nRBC 0.0 0.0 - 0.2 %  hCG, quantitative, pregnancy     Status: Abnormal   Collection Time: 09/08/22  2:08 AM  Result Value Ref Range   hCG, Beta Chain, Quant, S 25,633 (H) <5 mIU/mL  Comprehensive metabolic panel     Status: Abnormal   Collection Time: 09/08/22  2:08 AM  Result Value Ref Range   Sodium 138 135 - 145 mmol/L   Potassium 4.0 3.5 -  5.1 mmol/L   Chloride 108 98 - 111 mmol/L   CO2 22 22 - 32 mmol/L   Glucose, Bld 101 (H) 70 - 99 mg/dL   BUN 5 (L) 6 - 20 mg/dL   Creatinine, Ser 5.95 0.44 - 1.00 mg/dL   Calcium 9.3 8.9 - 63.8 mg/dL   Total Protein 7.2 6.5 - 8.1 g/dL   Albumin 3.6 3.5 - 5.0 g/dL   AST 16 15 - 41 U/L   ALT 18 0 - 44 U/L   Alkaline Phosphatase 70 38 - 126 U/L   Total Bilirubin 0.3 0.3 - 1.2 mg/dL   GFR, Estimated >75 >64 mL/min   Anion gap 8 5 - 15  Wet prep, genital     Status: Abnormal   Collection Time: 09/08/22  2:22 AM  Result Value Ref Range   Yeast Wet Prep HPF POC NONE SEEN NONE SEEN   Trich, Wet Prep NONE SEEN NONE SEEN   Clue Cells Wet Prep HPF POC PRESENT (A) NONE SEEN   WBC, Wet Prep HPF POC >=10 (A) <10   Sperm NONE SEEN    US OB Comp Less 14 Wks  Result Date: 09/08/2022 CLINICAL DATA:  Vaginal bleeding with positive pregnancy test EXAM: OBSTETRIC <14 WK ULTRASOUND TECHNIQUE: Transabdominal ultrasound was performed for evaluation of the gestation as well as the maternal uterus and adnexal regions. Patient refused transvaginal imaging. COMPARISON:  None available FINDINGS: Intrauterine gestational sac: Present Yolk sac:  Absent Embryo:  Present Cardiac Activity: Absent CRL:   16.6 mm   8 w 1 d Subchorionic hemorrhage:  None visualized. Maternal uterus/adnexae: Within normal limits. IMPRESSION: Findings meet definitive criteria for failed pregnancy. This follows SRU consensus guidelines: Diagnostic Criteria for Nonviable Pregnancy Early in the First Trimester. [facility] Mis J Med [phone]. Electronically Signed   By: Alcide Clever M.D.   On: 09/08/2022 02:58    MDM Exam Cultures: Wet Prep and GC/CT Labs: UA, UPT, CBC, hCG, ABO Ultrasound Assessment and Plan  21 year old MAB  -Labs and [place] ordered while patient in lobby. -Results return and provider to bedside. -Reviewed findings c/w MAB and BV. -Condolences given. -Reviewed expectant management and patient agreeable. Information  placed in AVS.  -Informed  that BV would not currently be treated and to monitor symptoms. -Message sent to [facility] for scheduling of SAB follow up in 2 weeks. -Bleeding precautions reviewed.  -Encouraged to call primary office or return to MAU if symptoms worsen or with the onset of new symptoms. -Discharged to home in stable condition.  Cherre Robins 09/08/2022, 3:23 AM

## 2022-09-21 ENCOUNTER — Encounter (HOSPITAL_COMMUNITY): Payer: Self-pay | Admitting: Obstetrics & Gynecology

## 2022-09-21 ENCOUNTER — Other Ambulatory Visit: Payer: Self-pay

## 2022-09-21 ENCOUNTER — Inpatient Hospital Stay (HOSPITAL_COMMUNITY)
Admission: AD | Admit: 2022-09-21 | Discharge: 2022-09-21 | Disposition: A | Discharge status: Home / Self Care | Payer: Self-pay | Attending: Obstetrics & Gynecology | Admitting: Obstetrics & Gynecology

## 2022-09-21 DIAGNOSIS — Z3A11 11 weeks gestation of pregnancy: Secondary | ICD-10-CM

## 2022-09-21 DIAGNOSIS — O039 Complete or unspecified spontaneous abortion without complication: Secondary | ICD-10-CM

## 2022-09-21 LAB — HCG, QUANTITATIVE, PREGNANCY: hCG, Beta Chain, Quant, S: 19 m[IU]/mL — ABNORMAL HIGH (ref <5)

## 2022-09-21 NOTE — MAU Note (Signed)
Virginia Burke is a 21 y.o. at [redacted]w[redacted]d here in MAU reporting: states she had a miscarriage 2 weeks ago and was told to return to MAU to make sure everything was okay. No pain or bleeding.   Onset of complaint: ongoing  Pain score: 0/10  Vitals:   09/21/22 1430  BP: 113/66  Pulse: 85  Resp: 16  Temp: 98.1 F (36.7 C)  SpO2: 100%     FHT:NA  Lab orders placed from triage: hcg

## 2022-09-21 NOTE — Discharge Instructions (Signed)
Please go to [facility] Family Planning section for birth control.

## 2022-09-21 NOTE — MAU Provider Note (Signed)
History   Chief Complaint:  Follow-up   Virginia Burke is  21 y.o. G2P0 Patient's last menstrual period was 06/30/2022 (approximate).. Patient is here for follow up of quantitative HCG and ongoing surveillance of pregnancy status. She is s/p MAB (09/07/2022).    Since her last visit, the patient is without new complaint. The patient reports bleeding as  none now.  She denies any pain.  General ROS:  negative  Her previous Quantitative HCG values are:  Lab Results  Component Value Date   HCGBETAQNT 19 (H) 09/21/2022   HCGBETAQNT 89,381 (H) 09/08/2022    Physical Exam   Blood pressure 113/66, pulse 85, temperature 98.1 F (36.7 C), temperature source Oral, resp. rate 16, last menstrual period 06/30/2022, SpO2 100 %, unknown if currently breastfeeding.  Focused Gynecological Exam: examination not indicated  Labs: Results for orders placed or performed during the hospital encounter of 09/21/22 (from the past 24 hour(s))  hCG, quantitative, pregnancy   Collection Time: 09/21/22  2:27 PM  Result Value Ref Range   hCG, Beta Chain, Quant, S 19 (H) <5 mIU/mL    Ultrasound Studies:   US OB Comp Less 14 Wks  Result Date: 09/08/2022 CLINICAL DATA:  Vaginal bleeding with positive pregnancy test EXAM: OBSTETRIC <14 WK ULTRASOUND TECHNIQUE: Transabdominal ultrasound was performed for evaluation of the gestation as well as the maternal uterus and adnexal regions. Patient refused transvaginal imaging. COMPARISON:  None available FINDINGS: Intrauterine gestational sac: Present Yolk sac:  Absent Embryo:  Present Cardiac Activity: Absent CRL:   16.6 mm   8 w 1 d Subchorionic hemorrhage:  None visualized. Maternal uterus/adnexae: Within normal limits. IMPRESSION: Findings meet definitive criteria for failed pregnancy. This follows SRU consensus guidelines: Diagnostic Criteria for Nonviable Pregnancy Early in the First Trimester. [facility] Mis J Med [phone]. Electronically Signed   By: Alcide Clever M.D.   On: 09/08/2022 02:58    Assessment:   1. Complete miscarriage      Plan: -Discharge home in stable condition -Patient advised to follow-up with [facility] Family Planning section for birth control -Patient may return to MAU as needed or if her condition were to change or worsen  Raelyn Mora, CNM 09/21/2022, 3:34 PM

## 2023-04-21 ENCOUNTER — Encounter (HOSPITAL_COMMUNITY): Payer: Self-pay | Admitting: Obstetrics & Gynecology

## 2023-04-21 ENCOUNTER — Inpatient Hospital Stay (HOSPITAL_COMMUNITY): Payer: Self-pay

## 2023-04-21 ENCOUNTER — Inpatient Hospital Stay (HOSPITAL_COMMUNITY)
Admission: AD | Admit: 2023-04-21 | Discharge: 2023-04-21 | Disposition: A | Discharge status: Home / Self Care | Payer: Self-pay | Attending: Obstetrics & Gynecology | Admitting: Obstetrics & Gynecology

## 2023-04-21 DIAGNOSIS — O209 Hemorrhage in early pregnancy, unspecified: Secondary | ICD-10-CM

## 2023-04-21 DIAGNOSIS — O26851 Spotting complicating pregnancy, first trimester: Secondary | ICD-10-CM | POA: Insufficient documentation

## 2023-04-21 DIAGNOSIS — O3680X0 Pregnancy with inconclusive fetal viability, not applicable or unspecified: Secondary | ICD-10-CM | POA: Insufficient documentation

## 2023-04-21 DIAGNOSIS — Z3A01 Less than 8 weeks gestation of pregnancy: Secondary | ICD-10-CM | POA: Insufficient documentation

## 2023-04-21 LAB — POCT PREGNANCY, URINE: Preg Test, Ur: POSITIVE — AB

## 2023-04-21 LAB — HIV ANTIBODY (ROUTINE TESTING W REFLEX): HIV Screen 4th Generation wRfx: NONREACTIVE

## 2023-04-21 LAB — COMPREHENSIVE METABOLIC PANEL
ALT: 17 U/L (ref 0–44)
AST: 15 U/L (ref 15–41)
Albumin: 3.6 g/dL (ref 3.5–5.0)
Alkaline Phosphatase: 70 U/L (ref 38–126)
Anion gap: 9 (ref 5–15)
BUN: 5 mg/dL — ABNORMAL LOW (ref 6–20)
CO2: 23 mmol/L (ref 22–32)
Calcium: 9 mg/dL (ref 8.9–10.3)
Chloride: 105 mmol/L (ref 98–111)
Creatinine, Ser: 0.64 mg/dL (ref 0.44–1.00)
GFR, Estimated: >60 mL/min (ref >60)
Glucose, Bld: 118 mg/dL — ABNORMAL HIGH (ref 70–99)
Potassium: 3.7 mmol/L (ref 3.5–5.1)
Sodium: 137 mmol/L (ref 135–145)
Total Bilirubin: 0.2 mg/dL — ABNORMAL LOW (ref 0.3–1.2)
Total Protein: 7.2 g/dL (ref 6.5–8.1)

## 2023-04-21 LAB — CBC
HCT: 37 % (ref 36.0–46.0)
Hemoglobin: 12.1 g/dL (ref 12.0–15.0)
MCH: 26.4 pg (ref 26.0–34.0)
MCHC: 32.7 g/dL (ref 30.0–36.0)
MCV: 80.8 fL (ref 80.0–100.0)
Platelets: 226 10*3/uL (ref 150–400)
RBC: 4.58 MIL/uL (ref 3.87–5.11)
RDW: 14.5 % (ref 11.5–15.5)
WBC: 8.4 10*3/uL (ref 4.0–10.5)
nRBC: 0 % (ref 0.0–0.2)

## 2023-04-21 LAB — WET PREP, GENITAL
Clue Cells Wet Prep HPF POC: NONE SEEN
Sperm: NONE SEEN
Trich, Wet Prep: NONE SEEN
WBC, Wet Prep HPF POC: >=10 — AB (ref <10)
Yeast Wet Prep HPF POC: NONE SEEN

## 2023-04-21 LAB — GC/CHLAMYDIA PROBE AMP (~~LOC~~) NOT AT ARMC
Chlamydia: NEGATIVE
Comment: NEGATIVE
Comment: NORMAL
Neisseria Gonorrhea: NEGATIVE

## 2023-04-21 LAB — HCG, QUANTITATIVE, PREGNANCY: hCG, Beta Chain, Quant, S: 21347 m[IU]/mL — ABNORMAL HIGH (ref <5)

## 2023-04-21 NOTE — MAU Note (Signed)
.  Virginia Burke is a 22 y.o. at [redacted]w[redacted]d here in MAU reporting vag bleeding around 2330. Had some pressure earlier but that has subsided. No pain. No recent intercourse LMP: 02/27/23 Onset of complaint: 2330 Pain score: 0 Vitals:   04/21/23 0051 04/21/23 0053  BP:  126/64  Pulse: 73   Resp: 17   Temp: 98.2 F (36.8 C)   SpO2: 100%      FHT:n/a Lab orders placed from triage: upt

## 2023-04-21 NOTE — MAU Provider Note (Signed)
Chief Complaint: No chief complaint on file.   Event Date/Time   First Provider Initiated Contact with Patient 04/21/23 0146        SUBJECTIVE HPI: Virginia Burke is a 22 y.o. G1P0010 at Unknown by LMP who presents to maternity admissions reporting vaginal bleeding.  No pain.  History of SAB in December.  . She denies vaginal bleeding, vaginal itching/burning, urinary symptoms, h/a, dizziness, n/v, or fever/chills.    Scheduled to see doctor in [facility] next week  Vaginal Bleeding The patient's primary symptoms include vaginal bleeding. The patient's pertinent negatives include no genital itching, genital odor or pelvic pain. This is a new problem. The current episode started today. The patient is experiencing no pain. She is pregnant. Pertinent negatives include no abdominal pain, back pain or chills. The vaginal discharge was bloody. The vaginal bleeding is lighter than menses. She has not been passing clots. She has not been passing tissue. Nothing aggravates the symptoms. She has tried nothing for the symptoms.   RN Note: Virginia Burke is a 23 y.o. at 5weeks here in MAU reporting vag bleeding around 2330. Had some pressure earlier but that has subsided. No pain. No recent intercourse LMP: 02/27/23 Onset of complaint: 2330 Pain score: 0  Past Medical History:  Diagnosis Date   Hypertension    No past surgical history on file. Social History   Socioeconomic History   Marital status: Significant Other    Spouse name: Not on file   Number of children: Not on file   Years of education: Not on file   Highest education level: Not on file  Occupational History   Not on file  Tobacco Use   Smoking status: Never   Smokeless tobacco: Never  Vaping Use   Vaping status: Former   Substances: Nicotine  Substance and Sexual Activity   Alcohol use: Never   Drug use: Never   Sexual activity: Not Currently  Other Topics Concern   Not on file  Social History Narrative    Not on file   Social Determinants of Health   Financial Resource Strain: Not on file  Food Insecurity: Not on file  Transportation Needs: Not on file  Physical Activity: Not on file  Stress: Not on file  Social Connections: Not on file  Intimate Partner Violence: Not on file   No current facility-administered medications on file prior to encounter.   Current Outpatient Medications on File Prior to Encounter  Medication Sig Dispense Refill   Prenatal Vit-Fe Fumarate-FA (PRENATAL MULTIVITAMIN) TABS tablet Take 1 tablet by mouth daily at 12 noon.     No Known Allergies  I have reviewed patient's Past Medical Hx, Surgical Hx, Family Hx, Social Hx, medications and allergies.   ROS:  Review of Systems  Constitutional:  Negative for chills.  Gastrointestinal:  Negative for abdominal pain.  Genitourinary:  Positive for vaginal bleeding. Negative for pelvic pain.  Musculoskeletal:  Negative for back pain.   Review of Systems  Other systems negative   Physical Exam  Physical Exam Patient Vitals for the past 24 hrs:  BP Temp Pulse Resp SpO2 Height Weight  04/21/23 0053 126/64 -- -- -- -- -- --  04/21/23 0051 -- 98.2 F (36.8 C) 73 17 100 % 4\' 9"  (1.448 m) 79.8 kg   Constitutional: Well-developed, well-nourished female in no acute distress.  Cardiovascular: normal rate Respiratory: normal effort GI: Abd soft, non-tender.  MS: Extremities nontender, no edema, normal ROM Neurologic: Alert and oriented  x 4.  GU: Neg CVAT.  PELVIC EXAM: deferred in lieu of ultrasound  LAB RESULTS Results for orders placed or performed during the hospital encounter of 04/21/23 (from the past 24 hour(s))  Pregnancy, urine POC     Status: Abnormal   Collection Time: 04/21/23  1:33 AM  Result Value Ref Range   Preg Test, Ur POSITIVE (A) NEGATIVE  CBC     Status: None   Collection Time: 04/21/23  1:50 AM  Result Value Ref Range   WBC 8.4 4.0 - 10.5 K/uL   RBC 4.58 3.87 - 5.11 MIL/uL    Hemoglobin 12.1 12.0 - 15.0 g/dL   HCT 11.9 14.7 - 82.9 %   MCV 80.8 80.0 - 100.0 fL   MCH 26.4 26.0 - 34.0 pg   MCHC 32.7 30.0 - 36.0 g/dL   RDW 56.2 13.0 - 86.5 %   Platelets 226 150 - 400 K/uL   nRBC 0.0 0.0 - 0.2 %  Comprehensive metabolic panel     Status: Abnormal   Collection Time: 04/21/23  1:50 AM  Result Value Ref Range   Sodium 137 135 - 145 mmol/L   Potassium 3.7 3.5 - 5.1 mmol/L   Chloride 105 98 - 111 mmol/L   CO2 23 22 - 32 mmol/L   Glucose, Bld 118 (H) 70 - 99 mg/dL   BUN 5 (L) 6 - 20 mg/dL   Creatinine, Ser 7.84 0.44 - 1.00 mg/dL   Calcium 9.0 8.9 - 69.6 mg/dL   Total Protein 7.2 6.5 - 8.1 g/dL   Albumin 3.6 3.5 - 5.0 g/dL   AST 15 15 - 41 U/L   ALT 17 0 - 44 U/L   Alkaline Phosphatase 70 38 - 126 U/L   Total Bilirubin 0.2 (L) 0.3 - 1.2 mg/dL   GFR, Estimated >29 >52 mL/min   Anion gap 9 5 - 15  hCG, quantitative, pregnancy     Status: Abnormal   Collection Time: 04/21/23  1:50 AM  Result Value Ref Range   hCG, Beta Chain, Quant, S 21,347 (H) <5 mIU/mL  HIV Antibody (routine testing w rflx)     Status: None   Collection Time: 04/21/23  1:50 AM  Result Value Ref Range   HIV Screen 4th Generation wRfx Non Reactive Non Reactive  Wet prep, genital     Status: Abnormal   Collection Time: 04/21/23  2:24 AM   Specimen: Vaginal  Result Value Ref Range   Yeast Wet Prep HPF POC NONE SEEN NONE SEEN   Trich, Wet Prep NONE SEEN NONE SEEN   Clue Cells Wet Prep HPF POC NONE SEEN NONE SEEN   WBC, Wet Prep HPF POC >=10 (A) <10   Sperm NONE SEEN      IMAGING US OB LESS THAN 14 WEEKS WITH OB TRANSVAGINAL  Result Date: 04/21/2023 CLINICAL DATA:  Initial evaluation for early pregnancy, vaginal bleeding. EXAM: OBSTETRIC <14 WK [place] AND TRANSVAGINAL OB US TECHNIQUE: Both transabdominal and transvaginal ultrasound examinations were performed for complete evaluation of the gestation as well as the maternal uterus, adnexal regions, and pelvic cul-de-sac. Transvaginal technique  was performed to assess early pregnancy. COMPARISON:  None Available. FINDINGS: Intrauterine gestational sac: Single Yolk sac:  Present Embryo:  Present Cardiac Activity: Present Heart Rate: 129 bpm CRL:  3.0 mm   5 w   6 d                  [place] EDC: 12/16/2023 Subchorionic hemorrhage:  None visualized. Maternal uterus/adnexae: Ovaries within normal limits. No adnexal mass or free fluid. IMPRESSION: 1. Single viable IUP, estimated gestational age [redacted] weeks and 6 days by crown-rump length, with ultrasound EDC of 12/16/2023. No complication. 2. No other acute maternal uterine or adnexal abnormality. Electronically Signed   By: Rise Mu M.D.   On: 04/21/2023 02:28     MAU Management/MDM: I have reviewed the triage vital signs and the nursing notes.   Pertinent labs & imaging results that were available during my care of the patient were reviewed by me and considered in my medical decision making (see chart for details).      I have reviewed her medical records including past results, notes and treatments. Medical, Surgical, and family history were reviewed.  Medications and recent lab tests were reviewed  Ordered usual first trimester r/o ectopic labs.   Will check baseline Ultrasound to rule out ectopic.  This bleeding/pain can represent a normal pregnancy with bleeding, spontaneous abortion or even an ectopic which can be life-threatening.  The process as listed above helps to determine which of these is present.  Reviewed results Encouraged to start prenatal care   ASSESSMENT Pregnancy of unknown location Bleeding in early pregnancy  Single live IUP at [redacted]w[redacted]d   PLAN Discharge home Pelvic rest Encouraged to start prenatal care  Pt stable at time of discharge. Encouraged to return here if she develops worsening of symptoms, increase in pain, fever, or other concerning symptoms.    Wynelle Bourgeois CNM, MSN Certified Nurse-Midwife 04/21/2023  1:46 AM

## 2023-04-21 NOTE — Progress Notes (Signed)
Hansel Feinstein CNM in to discuss test results and d/c plan. Written and verbal d/c instructions given and understanding voiced.

## 2023-05-24 ENCOUNTER — Inpatient Hospital Stay (HOSPITAL_COMMUNITY)
Admission: AD | Admit: 2023-05-24 | Discharge: 2023-05-24 | Disposition: A | Discharge status: Home / Self Care | Payer: Self-pay | Attending: Obstetrics and Gynecology | Admitting: Obstetrics and Gynecology

## 2023-05-24 ENCOUNTER — Other Ambulatory Visit: Payer: Self-pay

## 2023-05-24 DIAGNOSIS — O209 Hemorrhage in early pregnancy, unspecified: Secondary | ICD-10-CM

## 2023-05-24 DIAGNOSIS — Z3A10 10 weeks gestation of pregnancy: Secondary | ICD-10-CM | POA: Insufficient documentation

## 2023-05-24 DIAGNOSIS — O26851 Spotting complicating pregnancy, first trimester: Secondary | ICD-10-CM | POA: Insufficient documentation

## 2023-05-24 DIAGNOSIS — Z6740 Type O blood, Rh positive: Secondary | ICD-10-CM | POA: Insufficient documentation

## 2023-05-24 NOTE — MAU Note (Signed)
Virginia Burke is a 22 y.o. at [redacted]w[redacted]d here in MAU reporting: she had one episode of pin discharge with wiping.  Denies pain and recent intercourse. LMP: NA Onset of complaint: today Pain score: 0 Vitals:   05/24/23 1151  BP: 112/65  Resp: 18  Temp: 98.3 F (36.8 C)  SpO2: 100%     FHT: attempted none heard Lab orders placed from triage:   UA

## 2023-05-24 NOTE — MAU Provider Note (Signed)
History     [id number]  Arrival date and time: 05/24/23 1131    Chief Complaint  Patient presents with   Vaginal Bleeding     HPI Virginia Burke is a 22 y.o. at [redacted]w[redacted]d by 5 wk [place], who presents for vaginal bleeding.   Patient seen in MAU on 04/21/2023 for same, had viable IUP on [place] Today reports vaginal spotting which worried her, came for evaluation No heavy bleeding No abdominal pain No vaginal discharge      OB History     Gravida  2   Para  0   Term  0   Preterm      AB  1   Living         SAB  1   IAB      Ectopic      Multiple      Live Births              Past Medical History:  Diagnosis Date   Hypertension     No past surgical history on file.  Family History  Problem Relation Age of Onset   Diabetes Mother    Diabetes Father    Diabetes Maternal Grandmother    Diabetes Maternal Grandfather    Diabetes Paternal Grandmother    Diabetes Paternal Grandfather     Social History   Socioeconomic History   Marital status: Significant Other    Spouse name: Not on file   Number of children: Not on file   Years of education: Not on file   Highest education level: Not on file  Occupational History   Not on file  Tobacco Use   Smoking status: Never   Smokeless tobacco: Never  Vaping Use   Vaping status: Former   Substances: Nicotine  Substance and Sexual Activity   Alcohol use: Never   Drug use: Never   Sexual activity: Not Currently  Other Topics Concern   Not on file  Social History Narrative   Not on file   Social Determinants of Health   Financial Resource Strain: Not on file  Food Insecurity: Not on file  Transportation Needs: Not on file  Physical Activity: Not on file  Stress: Not on file  Social Connections: Not on file  Intimate Partner Violence: Not on file    No Known Allergies  No current facility-administered medications on file prior to encounter.   Current Outpatient Medications on File  Prior to Encounter  Medication Sig Dispense Refill   Prenatal Vit-Fe Fumarate-FA (PRENATAL MULTIVITAMIN) TABS tablet Take 1 tablet by mouth daily at 12 noon.       ROS Pertinent positives and negative per HPI, all others reviewed and negative  Physical Exam   BP 112/65 (BP Location: Right Arm)   Temp 98.3 F (36.8 C) (Oral)   Resp 18   Ht 4\' 9"  (1.448 m)   Wt 77.5 kg   LMP 02/27/2023 (Approximate)   SpO2 100%   BMI 36.96 kg/m   Patient Vitals for the past 24 hrs:  BP Temp Temp src Resp SpO2 Height Weight  05/24/23 1151 112/65 98.3 F (36.8 C) Oral 18 100 % -- --  05/24/23 1147 -- -- -- -- -- 4\' 9"  (1.448 m) 77.5 kg    Physical Exam Vitals reviewed.  Constitutional:      General: She is not in acute distress.    Appearance: She is well-developed. She is not diaphoretic.  Eyes:     General:  No scleral icterus. Pulmonary:     Effort: Pulmonary effort is normal. No respiratory distress.  Abdominal:     General: There is no distension.     Palpations: Abdomen is soft.     Tenderness: There is no abdominal tenderness. There is no guarding or rebound.  Skin:    General: Skin is warm and dry.  Neurological:     Mental Status: She is alert.     Coordination: Coordination normal.      Cervical Exam    Bedside Ultrasound Pt informed that the ultrasound is considered a limited OB ultrasound and is not intended to be a complete ultrasound exam.  Patient also informed that the ultrasound is not being completed with the intent of assessing for fetal or placental anomalies or any pelvic abnormalities.  Explained that the purpose of today's ultrasound is to assess for  viability.  Patient acknowledges the purpose of the exam and the limitations of the study.      My interpretation: First trimester findings: Gestational sac summary: fetal pole seen Fetal cardiac activity: 169 bpm by M mode   Labs No results found for this or any previous visit (from the past 24  hour(s)).  Imaging No results found.  MAU Course  Procedures Lab Orders  No laboratory test(s) ordered today   No orders of the defined types were placed in this encounter.  Imaging Orders  No imaging studies ordered today    MDM Moderate (Level 3-4)  Assessment and Plan  #Vaginal bleeding in pregnancy, first trimester #[redacted] weeks gestation of pregnancy US shows viable IUP. We discussed that vaginal bleeding in the first trimester is common, and that 80-90% of patients will go on to have a normal pregnancy with a live delivery. The remainder are at increased risk for miscarriage, unfortunately there are no known interventions to mitigate this risk. Blood type O positive on prior checks, rhogam not indicated. We discussed return precautions including crescendo abdominal pain, heavy vaginal bleeding soaking >1 pad/hour, and fever.   Dispo: discharged to home in stable condition    Venora Maples, MD/MPH 05/24/23 12:19 PM  Allergies as of 05/24/2023   No Known Allergies      Medication List     TAKE these medications    prenatal multivitamin Tabs tablet Take 1 tablet by mouth daily at 12 noon.

## 2023-05-24 NOTE — Discharge Instructions (Signed)
Your ultrasound shows a healthy pregnancy. We discussed that vaginal bleeding in the first trimester is common, and that 80-90% of patients will go on to have a normal pregnancy with a live delivery. The remainder are at increased risk for miscarriage, unfortunately there are no known interventions to mitigate this risk. We discussed return precautions including crescendo abdominal pain, heavy vaginal bleeding soaking >1 pad/hour, and fever.

## 2023-06-15 LAB — OB RESULTS CONSOLE HEPATITIS B SURFACE ANTIGEN: Hepatitis B Surface Ag: NEGATIVE

## 2023-06-15 LAB — OB RESULTS CONSOLE RUBELLA ANTIBODY, IGM: Rubella: IMMUNE

## 2023-06-15 LAB — OB RESULTS CONSOLE VARICELLA ZOSTER ANTIBODY, IGG: Varicella: IMMUNE

## 2023-08-12 ENCOUNTER — Inpatient Hospital Stay (HOSPITAL_COMMUNITY)
Admission: AD | Admit: 2023-08-12 | Discharge: 2023-08-12 | Disposition: A | Discharge status: Home / Self Care | Payer: Self-pay | Attending: Family Medicine | Admitting: Family Medicine

## 2023-08-12 ENCOUNTER — Encounter (HOSPITAL_COMMUNITY): Payer: Self-pay | Admitting: Family Medicine

## 2023-08-12 DIAGNOSIS — Z3A22 22 weeks gestation of pregnancy: Secondary | ICD-10-CM

## 2023-08-12 DIAGNOSIS — O26892 Other specified pregnancy related conditions, second trimester: Secondary | ICD-10-CM

## 2023-08-12 DIAGNOSIS — O26899 Other specified pregnancy related conditions, unspecified trimester: Secondary | ICD-10-CM

## 2023-08-12 DIAGNOSIS — R102 Pelvic and perineal pain: Secondary | ICD-10-CM

## 2023-08-12 LAB — URINALYSIS, ROUTINE W REFLEX MICROSCOPIC
Bilirubin Urine: NEGATIVE
Glucose, UA: NEGATIVE mg/dL
Hgb urine dipstick: NEGATIVE
Ketones, ur: NEGATIVE mg/dL
Nitrite: NEGATIVE
Protein, ur: NEGATIVE mg/dL
Specific Gravity, Urine: 1.004 — ABNORMAL LOW (ref 1.005–1.030)
pH: 7 (ref 5.0–8.0)

## 2023-08-12 NOTE — MAU Note (Signed)
..  Virginia Burke is a 22 y.o. at [redacted]w[redacted]d here in MAU reporting: intermittent lower abdominal pressure that began 3 hours ago (7pm) reports it feels like she has to void but the pain is still there after she voids.  Denies vaginal bleeding or leaking of fluid.   Pain score: 4/10 Vitals:   08/12/23 2233  BP: 121/68  Pulse: 91  Resp: 16  Temp: 98.3 F (36.8 C)  SpO2: 100%     FHT:156 Lab orders placed from triage:  UA

## 2023-08-12 NOTE — MAU Provider Note (Incomplete)
History     CSN: [id number]  Arrival date and time: 08/12/23 2155   Event Date/Time   First Provider Initiated Contact with Patient 08/12/23 2313      Chief Complaint  Patient presents with   Abdominal Pain    Virginia Burke is a 22 y.o. [name] at [redacted]w[redacted]d who receives care at [facility] in [place].  Patient reports her next appt is Nov 19th.   She presents today for abdominal pain.  She describes the pain as pressure that started around 1900.  She states the pain is intermittent and lasts ~ 30 minutes before subsiding.  She does report increased walking today and quesitons if this contributed to symptom onset.  She denies relieving or worsening factors.  She rates the pain a 4/10.    Patient endorses fetal movement. She denies vaginal concerns. No recent sexual activity.     {GYN/OB [id number]  Past Medical History:  Diagnosis Date   Hypertension     History reviewed. No pertinent surgical history.  Family History  Problem Relation Age of Onset   Diabetes Mother    Diabetes Father    Diabetes Maternal Grandmother    Diabetes Maternal Grandfather    Diabetes Paternal Grandmother    Diabetes Paternal Grandfather     Social History   Tobacco Use   Smoking status: Never   Smokeless tobacco: Never  Vaping Use   Vaping status: Former   Substances: Nicotine  Substance Use Topics   Alcohol use: Never   Drug use: Never    Allergies: No Known Allergies  Medications Prior to Admission  Medication Sig Dispense Refill Last Dose   Prenatal Vit-Fe Fumarate-FA (PRENATAL MULTIVITAMIN) TABS tablet Take 1 tablet by mouth daily at 12 noon.   08/12/2023    Review of Systems  Gastrointestinal:  Positive for abdominal pain (Pressure). Negative for constipation, diarrhea, nausea and vomiting.  Genitourinary:  Negative for difficulty urinating, dysuria, vaginal bleeding and vaginal discharge.  Neurological:  Negative for dizziness, light-headedness and  headaches.   Physical Exam   Blood pressure 121/68, pulse 91, temperature 98.3 F (36.8 C), temperature source Oral, resp. rate 16, height 4\' 9"  (1.448 m), weight 79.3 kg, last menstrual period 02/27/2023, SpO2 100%, unknown if currently breastfeeding.  Physical Exam Vitals and nursing note reviewed.  HENT:     Head: Normocephalic and atraumatic.  Pulmonary:     Effort: Pulmonary effort is normal. No respiratory distress.  Abdominal:     Palpations: Abdomen is soft.     Tenderness: There is no abdominal tenderness.     Comments: Gravid, Appears LGA  Skin:    General: Skin is warm and dry.     MAU Course  Procedures Results for orders placed or performed during the hospital encounter of 08/12/23 (from the past 24 hour(s))  Urinalysis, Routine w reflex microscopic -Urine, Clean Catch     Status: Abnormal   Collection Time: 08/12/23 10:03 PM  Result Value Ref Range   Color, Urine YELLOW YELLOW   APPearance HAZY (A) CLEAR   Specific Gravity, Urine 1.004 (L) 1.005 - 1.030   pH 7.0 5.0 - 8.0   Glucose, UA NEGATIVE NEGATIVE mg/dL   Hgb urine dipstick NEGATIVE NEGATIVE   Bilirubin Urine NEGATIVE NEGATIVE   Ketones, ur NEGATIVE NEGATIVE mg/dL   Protein, ur NEGATIVE NEGATIVE mg/dL   Nitrite NEGATIVE NEGATIVE   Leukocytes,Ua LARGE (A) NEGATIVE   RBC / HPF 0-5 0 - 5 RBC/hpf   WBC, UA  21-50 0 - 5 WBC/hpf   Bacteria, UA FEW (A) NONE SEEN   Squamous Epithelial / HPF 6-10 0 - 5 /HPF   Mucus PRESENT     MDM ***  Assessment and Plan  22 year old, [name]  SIUP at 22 weeks Abdominal Pressure  -Reviewed POC with patient. -Exam performed and findings discussed.  -*** -*** -***   Cherre Robins 08/12/2023, 11:13 PM

## 2023-08-14 LAB — CULTURE, OB URINE

## 2023-09-12 LAB — OB RESULTS CONSOLE HIV ANTIBODY (ROUTINE TESTING): HIV: NONREACTIVE

## 2023-09-12 LAB — OB RESULTS CONSOLE RPR: RPR: NONREACTIVE

## 2023-11-21 LAB — OB RESULTS CONSOLE GC/CHLAMYDIA
Chlamydia: NEGATIVE
Neisseria Gonorrhea: NEGATIVE

## 2023-11-21 LAB — OB RESULTS CONSOLE GBS: GBS: NEGATIVE

## 2023-12-04 ENCOUNTER — Inpatient Hospital Stay (HOSPITAL_COMMUNITY)
Admission: AD | Admit: 2023-12-04 | Discharge: 2023-12-08 | DRG: 786 | Disposition: A | Discharge status: Home / Self Care | Payer: MEDICAID | Attending: Obstetrics & Gynecology | Admitting: Obstetrics & Gynecology

## 2023-12-04 ENCOUNTER — Encounter (HOSPITAL_COMMUNITY): Payer: Self-pay | Admitting: Obstetrics and Gynecology

## 2023-12-04 ENCOUNTER — Other Ambulatory Visit: Payer: Self-pay

## 2023-12-04 DIAGNOSIS — Z833 Family history of diabetes mellitus: Secondary | ICD-10-CM | POA: Diagnosis not present

## 2023-12-04 DIAGNOSIS — O411230 Chorioamnionitis, third trimester, not applicable or unspecified: Secondary | ICD-10-CM | POA: Diagnosis present

## 2023-12-04 DIAGNOSIS — Z3A38 38 weeks gestation of pregnancy: Secondary | ICD-10-CM | POA: Diagnosis not present

## 2023-12-04 DIAGNOSIS — O368130 Decreased fetal movements, third trimester, not applicable or unspecified: Secondary | ICD-10-CM | POA: Diagnosis present

## 2023-12-04 DIAGNOSIS — O411290 Chorioamnionitis, unspecified trimester, not applicable or unspecified: Secondary | ICD-10-CM | POA: Diagnosis not present

## 2023-12-04 DIAGNOSIS — O24420 Gestational diabetes mellitus in childbirth, diet controlled: Secondary | ICD-10-CM | POA: Diagnosis present

## 2023-12-04 DIAGNOSIS — Z98891 History of uterine scar from previous surgery: Secondary | ICD-10-CM

## 2023-12-04 LAB — CBC
HCT: 33.9 % — ABNORMAL LOW (ref 36.0–46.0)
Hemoglobin: 10.8 g/dL — ABNORMAL LOW (ref 12.0–15.0)
MCH: 24.9 pg — ABNORMAL LOW (ref 26.0–34.0)
MCHC: 31.9 g/dL (ref 30.0–36.0)
MCV: 78.1 fL — ABNORMAL LOW (ref 80.0–100.0)
Platelets: 156 10*3/uL (ref 150–400)
RBC: 4.34 MIL/uL (ref 3.87–5.11)
RDW: 15.5 % (ref 11.5–15.5)
WBC: 7.4 10*3/uL (ref 4.0–10.5)
nRBC: 0 % (ref 0.0–0.2)

## 2023-12-04 LAB — HIV ANTIBODY (ROUTINE TESTING W REFLEX): HIV Screen 4th Generation wRfx: NONREACTIVE

## 2023-12-04 LAB — RUPTURE OF MEMBRANE (ROM)PLUS: Rom Plus: NEGATIVE

## 2023-12-04 LAB — TYPE AND SCREEN
ABO/RH(D): O POS
Antibody Screen: NEGATIVE

## 2023-12-04 LAB — HEMOGLOBIN A1C
Hgb A1c MFr Bld: 5.2 % (ref 4.8–5.6)
Mean Plasma Glucose: 102.54 mg/dL

## 2023-12-04 LAB — WET PREP, GENITAL
Clue Cells Wet Prep HPF POC: NONE SEEN
Sperm: NONE SEEN
Trich, Wet Prep: NONE SEEN
WBC, Wet Prep HPF POC: >=10 — AB (ref <10)
Yeast Wet Prep HPF POC: NONE SEEN

## 2023-12-04 LAB — POCT FERN TEST: POCT Fern Test: NEGATIVE

## 2023-12-04 LAB — GLUCOSE, CAPILLARY: Glucose-Capillary: 70 mg/dL (ref 70–99)

## 2023-12-04 MED ORDER — SODIUM CHLORIDE 0.9% FLUSH: 3.0000 mL | Freq: Two times a day (BID) | INTRAVENOUS | Status: DC

## 2023-12-04 MED ORDER — LACTATED RINGERS IV SOLN: INTRAVENOUS | Status: DC

## 2023-12-04 MED ORDER — MISOPROSTOL 50MCG HALF TABLET
50.0000 ug | ORAL_TABLET | Freq: Once | ORAL | Status: AC
  Administered 2023-12-04: 50 ug via BUCCAL
  Filled 2023-12-04: qty 1

## 2023-12-04 MED ORDER — OXYTOCIN BOLUS FROM INFUSION: 333.0000 mL | Freq: Once | INTRAVENOUS | Status: DC

## 2023-12-04 MED ORDER — ONDANSETRON HCL 4 MG/2ML IJ SOLN
4.0000 mg | Freq: Four times a day (QID) | INTRAMUSCULAR | Status: DC | PRN
  Administered 2023-12-05 (×2): 4 mg via INTRAVENOUS
  Filled 2023-12-04 (×2): qty 2

## 2023-12-04 MED ORDER — LIDOCAINE HCL (PF) 1 % IJ SOLN: 30.0000 mL | INTRAMUSCULAR | Status: DC | PRN

## 2023-12-04 MED ORDER — OXYTOCIN-SODIUM CHLORIDE 30-0.9 UT/500ML-% IV SOLN
2.5000 [IU]/h | INTRAVENOUS | Status: DC
  Filled 2023-12-04: qty 500

## 2023-12-04 MED ORDER — TERBUTALINE SULFATE 1 MG/ML IJ SOLN
0.2500 mg | Freq: Once | INTRAMUSCULAR | Status: DC | PRN
  Filled 2023-12-04: qty 1

## 2023-12-04 MED ORDER — SODIUM CHLORIDE 0.9 % IV SOLN: 250.0000 mL | INTRAVENOUS | Status: AC | PRN

## 2023-12-04 MED ORDER — OXYCODONE-ACETAMINOPHEN 5-325 MG PO TABS: 1.0000 | ORAL_TABLET | ORAL | Status: DC | PRN

## 2023-12-04 MED ORDER — OXYCODONE-ACETAMINOPHEN 5-325 MG PO TABS: 2.0000 | ORAL_TABLET | ORAL | Status: DC | PRN

## 2023-12-04 MED ORDER — HYDROXYZINE HCL 50 MG PO TABS: 50.0000 mg | ORAL_TABLET | Freq: Four times a day (QID) | ORAL | Status: DC | PRN

## 2023-12-04 MED ORDER — SODIUM CHLORIDE 0.9% FLUSH: 3.0000 mL | INTRAVENOUS | Status: DC | PRN

## 2023-12-04 MED ORDER — MISOPROSTOL 25 MCG QUARTER TABLET
25.0000 ug | ORAL_TABLET | Freq: Once | ORAL | Status: AC
  Administered 2023-12-04: 25 ug via VAGINAL
  Filled 2023-12-04: qty 1

## 2023-12-04 MED ORDER — FENTANYL CITRATE (PF) 100 MCG/2ML IJ SOLN
50.0000 ug | INTRAMUSCULAR | Status: DC | PRN
  Administered 2023-12-05 (×3): 100 ug via INTRAVENOUS
  Filled 2023-12-04 (×3): qty 2

## 2023-12-04 MED ORDER — SOD CITRATE-CITRIC ACID 500-334 MG/5ML PO SOLN
30.0000 mL | ORAL | Status: DC | PRN
  Administered 2023-12-06: 30 mL via ORAL
  Filled 2023-12-04: qty 30

## 2023-12-04 MED ORDER — ACETAMINOPHEN 325 MG PO TABS: 650.0000 mg | ORAL_TABLET | ORAL | Status: DC | PRN

## 2023-12-04 MED ORDER — LACTATED RINGERS IV SOLN
500.0000 mL | INTRAVENOUS | Status: DC | PRN
  Administered 2023-12-06: 500 mL via INTRAVENOUS

## 2023-12-04 NOTE — MAU Provider Note (Signed)
 Chief Complaint:  Abdominal Pain and Decreased Fetal Movement   HPI   None     Virginia Burke is a 23 y.o. [name] at [redacted]w[redacted]d who presents to maternity admissions reporting DFM, reports a change in movement today, this started this morning.  She reports cramping since yesterday around noon. Cramping is infrequent, only about once per hour additionally, she reports an increase in watery discharge that started this morning.   She normally receives care at [facility].   Pregnancy Course: GDM (diet controlled)  Past Medical History:  Diagnosis Date   Hypertension    OB History  Gravida Para Term Preterm AB Living  2 0 0  1   SAB IAB Ectopic Multiple Live Births  1        # Outcome Date GA Lbr Len/2nd Weight Sex Type Anes PTL Lv  2 Current           1 SAB 09/07/22 [redacted]w[redacted]d          Past Surgical History:  Procedure Laterality Date   NO PAST SURGERIES     Family History  Problem Relation Age of Onset   Diabetes Mother    Diabetes Father    Diabetes Maternal Grandmother    Diabetes Maternal Grandfather    Diabetes Paternal Grandmother    Diabetes Paternal Grandfather    Social History   Tobacco Use   Smoking status: Never   Smokeless tobacco: Never  Vaping Use   Vaping status: Former   Substances: Nicotine  Substance Use Topics   Alcohol use: Never   Drug use: Never   No Known Allergies Medications Prior to Admission  Medication Sig Dispense Refill Last Dose/Taking   Prenatal Vit-Fe Fumarate-FA (PRENATAL MULTIVITAMIN) TABS tablet Take 1 tablet by mouth daily at 12 noon.   12/04/2023 at  8:30 AM    I have reviewed patient's Past Medical Hx, Surgical Hx, Family Hx, Social Hx, medications and allergies.   ROS  Pertinent items noted in HPI and remainder of comprehensive ROS otherwise negative.   PHYSICAL EXAM  Patient Vitals for the past 24 hrs:  BP Temp Temp src Pulse Resp SpO2 Height Weight  12/04/23 1803 115/75 -- -- 97 -- -- -- --  12/04/23 1751 112/67 97.9  F (36.6 C) Oral 77 16 98 % -- --  12/04/23 1746 -- -- -- -- -- -- 4\' 8"  (1.422 m) 79.7 kg    Physical Exam Vitals and nursing note reviewed.  Constitutional:      Appearance: She is well-developed and normal weight.  HENT:     Head: Normocephalic.  Cardiovascular:     Rate and Rhythm: Normal rate.  Pulmonary:     Effort: Pulmonary effort is normal.  Skin:    General: Skin is warm and dry.     Capillary Refill: Capillary refill takes less than 2 seconds.  Neurological:     General: No focal deficit present.     Mental Status: She is alert and oriented to person, place, and time.  Psychiatric:        Mood and Affect: Mood normal.        Behavior: Behavior normal.         Fetal Tracing: Baseline: 145 Variability: mod  Accelerations: 15x15 Decelerations:absent Toco: UI, occ UC   Labs: Results for orders placed or performed during the hospital encounter of 12/04/23 (from the past 24 hours)  Wet prep, genital     Status: Abnormal   Collection Time:  12/04/23  6:33 PM   Specimen: PATH Cytology Cervicovaginal Ancillary Only  Result Value Ref Range   Yeast Wet Prep HPF POC NONE SEEN NONE SEEN   Trich, Wet Prep NONE SEEN NONE SEEN   Clue Cells Wet Prep HPF POC NONE SEEN NONE SEEN   WBC, Wet Prep HPF POC >=10 (A) <10   Sperm NONE SEEN   Fern Test     Status: None   Collection Time: 12/04/23  6:37 PM  Result Value Ref Range   POCT Fern Test Negative = intact amniotic membranes   Rupture of Membrane (ROM) Plus     Status: None   Collection Time: 12/04/23  6:43 PM  Result Value Ref Range   Rom Plus NEGATIVE     Imaging:  No results found.  MDM & MAU COURSE  MDM: DFM at term, continued perception of DFM in spite of reassuring strip.  Given this and GDM status, with patient desiring to stay, decision made to admit.   MAU Course: Orders Placed This Encounter  Procedures   Wet prep, genital   Group B strep by PCR   Rupture of Membrane (ROM) Plus   Rupture of  Membrane (ROM) Plus   CBC   RPR   Hemoglobin A1c   HIV Antibody (routine testing w rflx)   Fern Test   Type and screen [facility]   Insert and maintain IV Line   Meds ordered this encounter  Medications   lactated ringers infusion    ASSESSMENT   1. Decreased fetal movements in third trimester, single or unspecified fetus   2. Indication for care in labor or delivery   3. [redacted] weeks gestation of pregnancy     PLAN  Admit to labor.  DFM at term, continued perception of DFM in spite of reassuring strip.  Given this and GDM status, with patient desiring to stay, decision made to admit to L&D.     Lamont Snowball, MSN, CNM 12/04/2023 8:14 PM  Certified Nurse Midwife, [facility]

## 2023-12-04 NOTE — MAU Note (Signed)

## 2023-12-04 NOTE — MAU Note (Addendum)
.  Virginia Burke is a 23 y.o. at [redacted]w[redacted]d here in MAU reporting: less than normal FM since this morning and lower, lateral abdominal cramping that started yesterday (3/10). Cramping comes and goes about once every hour. Denies VB. Is not sure about LOF, reports clear/white discharge that is watery - noticed once this AM.   PNC at [facility] in [place] - plans to deliver there.  LMP: N/A Onset of complaint: Yesterday Pain score: see note above Vitals:   12/04/23 1751  BP: 112/67  Pulse: 77  Resp: 16  Temp: 97.9 F (36.6 C)  SpO2: 98%     FHT: 159  Lab orders placed from triage: N/A

## 2023-12-04 NOTE — Progress Notes (Addendum)
 Labor Progress Note  Virginia Burke is a 23 y.o. [name] at [redacted]w[redacted]d presented for DFM at term in s/o A1GDM  S: pt comfortably in bed, no concerns at this time.   O:  BP 124/78   Pulse 79   Temp 98.1 F (36.7 C) (Oral)   Resp 19   Ht 4\' 8"  (1.422 m)   Wt 79.7 kg   LMP 02/27/2023 (Approximate)   SpO2 98%   BMI 39.37 kg/m  EFM:140bpm/Moderate variability/ 15x15 accels/ None decels CAT: 1 Toco: none   CVE: Presentation: Vertex (bedside u/s by Ginnie Smart, RN)   A&P: 23 y.o. [name] [redacted]w[redacted]d  here for IOL as above  #Labor: Start IOL with Dcyto 50/25 #Pain: per patient request #FWB: CAT 1 #GBS negative  #Resolved polyhydramnios   #A1GDM: EFW 2700g - CBGs q 4hrs   Hessie Dibble, MD FMOB Fellow, Faculty practice [facility] [place], Center for [facility] 12/04/23  11:20 PM

## 2023-12-04 NOTE — H&P (Signed)
 OBSTETRIC ADMISSION HISTORY AND PHYSICAL  Virginia Burke is a 23 y.o. female [name] with IUP at [redacted]w[redacted]d by early [place] presenting for DFM. She reports +FMs, No LOF, no VB, no blurry vision, headaches or peripheral edema, and RUQ pain.  She plans on breast feeding. She request POPs for birth control. She received her prenatal care at  [facility] [place]    Dating: By LMP --->  Estimated Date of Delivery: 12/16/23  Sono:    @[redacted]w[redacted]d , CWD, normal anatomy, cephalic presentation, longitudinal lie, 643g, 54ile% EFW, posterior placenta   Prenatal History/Complications: GDM  Past Medical History: Past Medical History:  Diagnosis Date   Hypertension     Past Surgical History: Past Surgical History:  Procedure Laterality Date   NO PAST SURGERIES      Obstetrical History: OB History     Gravida  2   Para  0   Term  0   Preterm      AB  1   Living         SAB  1   IAB      Ectopic      Multiple      Live Births              Social History Social History   Socioeconomic History   Marital status: Significant Other    Spouse name: Not on file   Number of children: Not on file   Years of education: Not on file   Highest education level: Not on file  Occupational History   Not on file  Tobacco Use   Smoking status: Never   Smokeless tobacco: Never  Vaping Use   Vaping status: Former   Substances: Nicotine  Substance and Sexual Activity   Alcohol use: Never   Drug use: Never   Sexual activity: Not Currently  Other Topics Concern   Not on file  Social History Narrative   Not on file   Social Drivers of Health   Financial Resource Strain: Not on file  Food Insecurity: Not on file  Transportation Needs: Not on file  Physical Activity: Not on file  Stress: Not on file  Social Connections: Not on file    Family History: Family History  Problem Relation Age of Onset   Diabetes Mother    Diabetes Father    Diabetes Maternal  Grandmother    Diabetes Maternal Grandfather    Diabetes Paternal Grandmother    Diabetes Paternal Grandfather     Allergies: No Known Allergies  Medications Prior to Admission  Medication Sig Dispense Refill Last Dose/Taking   Prenatal Vit-Fe Fumarate-FA (PRENATAL MULTIVITAMIN) TABS tablet Take 1 tablet by mouth daily at 12 noon.   12/04/2023 at  8:30 AM     Review of Systems   All systems reviewed and negative except as stated in HPI  Blood pressure 115/75, pulse 97, temperature 97.9 F (36.6 C), temperature source Oral, resp. rate 16, height 4\' 8"  (1.422 m), weight 79.7 kg, last menstrual period 02/27/2023, SpO2 98%, unknown if currently breastfeeding. General appearance: alert, cooperative, and appears stated age Lungs: clear to auscultation bilaterally Heart: regular rate and rhythm Abdomen: soft, non-tender; bowel sounds normal Pelvic: deferred Extremities: Homans sign is negative, no sign of DVT DTR's +1 Presentation: cephalic Fetal monitoringBaseline: 145 bpm, Variability: Good {> 6 bpm), Accelerations: Reactive, and Decelerations: Absent Uterine activityDate/time of onset: 12/03/23 @ 1200, Frequency: 1 times per hour, and Duration: 60 seconds     Prenatal  labs: ABO, Rh: --/--/PENDING (03/10 2012) Antibody: PENDING (03/10 2012) Rubella:   RPR:    HBsAg:    HIV: Non Reactive (07/26 0150)  GBS: Negative/-- (02/25 0000)  Negative GTT A2GDM Genetic screening  WNL per patient Anatomy US WNL   There is no immunization history on file for this patient.  Prenatal Transfer Tool  Maternal Diabetes: Yes:  Diabetes Type:  Diet controlled Genetic Screening: Normal Maternal Ultrasounds/Referrals: Normal Fetal Ultrasounds or other Referrals:  Referred to Materal Fetal Medicine  Maternal Substance Abuse:  No Significant Maternal Medications:  None Significant Maternal Lab Results: Other: GBS unknown Number of Prenatal Visits:greater than 3 verified prenatal  visits Maternal Vaccinations:unknown Other Comments:  None   Results for orders placed or performed during the hospital encounter of 12/04/23 (from the past 24 hours)  Wet prep, genital   Collection Time: 12/04/23  6:33 PM   Specimen: PATH Cytology Cervicovaginal Ancillary Only  Result Value Ref Range   Yeast Wet Prep HPF POC NONE SEEN NONE SEEN   Trich, Wet Prep NONE SEEN NONE SEEN   Clue Cells Wet Prep HPF POC NONE SEEN NONE SEEN   WBC, Wet Prep HPF POC >=10 (A) <10   Sperm NONE SEEN   Fern Test   Collection Time: 12/04/23  6:37 PM  Result Value Ref Range   POCT Fern Test Negative = intact amniotic membranes   Rupture of Membrane (ROM) Plus   Collection Time: 12/04/23  6:43 PM  Result Value Ref Range   Rom Plus NEGATIVE   CBC   Collection Time: 12/04/23  8:12 PM  Result Value Ref Range   WBC 7.4 4.0 - 10.5 K/uL   RBC 4.34 3.87 - 5.11 MIL/uL   Hemoglobin 10.8 (L) 12.0 - 15.0 g/dL   HCT 16.1 (L) 09.6 - 04.5 %   MCV 78.1 (L) 80.0 - 100.0 fL   MCH 24.9 (L) 26.0 - 34.0 pg   MCHC 31.9 30.0 - 36.0 g/dL   RDW 40.9 81.1 - 91.4 %   Platelets 156 150 - 400 K/uL   nRBC 0.0 0.0 - 0.2 %  Type and screen [facility]   Collection Time: 12/04/23  8:12 PM  Result Value Ref Range   ABO/RH(D) PENDING    Antibody Screen PENDING    Sample Expiration      12/07/2023,2359 Performed at [facility], [address] [place], [place] [address]     There are no active problems to display for this patient.   Assessment/Plan:  Virginia Burke is a 23 y.o. [name] at [redacted]w[redacted]d here for DFM at term gestation. History of GDM  #Labor:IOL for DFM at term and GDM history.  #Pain: Coping well, epidural on request #FWB: Cat 1 #GBS status:  GBS negative #Feeding: Breastmilk  #Reproductive Life planning: Progesterone only pills #Circ:  no  Richardson Landry, CNM  12/04/2023, 8:30 PM

## 2023-12-05 ENCOUNTER — Inpatient Hospital Stay (HOSPITAL_COMMUNITY): Payer: Self-pay

## 2023-12-05 LAB — GLUCOSE, CAPILLARY
Glucose-Capillary: 101 mg/dL — ABNORMAL HIGH (ref 70–99)
Glucose-Capillary: 101 mg/dL — ABNORMAL HIGH (ref 70–99)
Glucose-Capillary: 103 mg/dL — ABNORMAL HIGH (ref 70–99)
Glucose-Capillary: 110 mg/dL — ABNORMAL HIGH (ref 70–99)
Glucose-Capillary: 60 mg/dL — ABNORMAL LOW (ref 70–99)
Glucose-Capillary: 65 mg/dL — ABNORMAL LOW (ref 70–99)
Glucose-Capillary: 67 mg/dL — ABNORMAL LOW (ref 70–99)
Glucose-Capillary: 68 mg/dL — ABNORMAL LOW (ref 70–99)
Glucose-Capillary: 69 mg/dL — ABNORMAL LOW (ref 70–99)
Glucose-Capillary: 69 mg/dL — ABNORMAL LOW (ref 70–99)
Glucose-Capillary: 70 mg/dL (ref 70–99)
Glucose-Capillary: 76 mg/dL (ref 70–99)
Glucose-Capillary: 95 mg/dL (ref 70–99)

## 2023-12-05 LAB — GC/CHLAMYDIA PROBE AMP (~~LOC~~) NOT AT ARMC
Chlamydia: NEGATIVE
Comment: NEGATIVE
Comment: NORMAL
Neisseria Gonorrhea: NEGATIVE

## 2023-12-05 LAB — RPR: RPR Ser Ql: NONREACTIVE

## 2023-12-05 MED ORDER — LACTATED RINGERS IV SOLN
500.0000 mL | Freq: Once | INTRAVENOUS | Status: AC
  Administered 2023-12-05: 500 mL via INTRAVENOUS

## 2023-12-05 MED ORDER — EPHEDRINE 5 MG/ML INJ: 10.0000 mg | INTRAVENOUS | Status: DC | PRN

## 2023-12-05 MED ORDER — OXYTOCIN-SODIUM CHLORIDE 30-0.9 UT/500ML-% IV SOLN
1.0000 m[IU]/min | INTRAVENOUS | Status: DC
  Administered 2023-12-05: 2 m[IU]/min via INTRAVENOUS

## 2023-12-05 MED ORDER — LIDOCAINE HCL (PF) 1 % IJ SOLN
INTRAMUSCULAR | Status: DC | PRN
  Administered 2023-12-05 (×2): 5 mL via EPIDURAL

## 2023-12-05 MED ORDER — TERBUTALINE SULFATE 1 MG/ML IJ SOLN: 0.2500 mg | Freq: Once | INTRAMUSCULAR | Status: DC | PRN

## 2023-12-05 MED ORDER — DIPHENHYDRAMINE HCL 50 MG/ML IJ SOLN: 12.5000 mg | INTRAMUSCULAR | Status: DC | PRN

## 2023-12-05 MED ORDER — FENTANYL-BUPIVACAINE-NACL 0.5-0.125-0.9 MG/250ML-% EP SOLN
12.0000 mL/h | EPIDURAL | Status: DC | PRN
  Administered 2023-12-05: 12 mL/h via EPIDURAL
  Filled 2023-12-05 (×2): qty 250

## 2023-12-05 MED ORDER — PHENYLEPHRINE 80 MCG/ML (10ML) SYRINGE FOR IV PUSH (FOR BLOOD PRESSURE SUPPORT): 80.0000 ug | PREFILLED_SYRINGE | INTRAVENOUS | Status: DC | PRN

## 2023-12-05 MED ORDER — ACETAMINOPHEN 500 MG PO TABS
1000.0000 mg | ORAL_TABLET | Freq: Once | ORAL | Status: AC
  Administered 2023-12-05: 1000 mg via ORAL
  Filled 2023-12-05: qty 2

## 2023-12-05 NOTE — Progress Notes (Signed)
 Labor Progress Note Virginia Burke is a 23 y.o. [name] at [redacted]w[redacted]d presented for DFM S: pt comfortable in bed. Epidural in place.   O:  BP (!) 100/56   Pulse 68   Temp 98 F (36.7 C) (Oral)   Resp 18   Ht 4\' 8"  (1.422 m)   Wt 79.7 kg   LMP 02/27/2023 (Approximate)   SpO2 98%   BMI 39.37 kg/m  EFM: 140bpm/Moderate variability/ 15x15 accels/ None decels   CVE: Dilation: 5.5 Effacement (%): 80 Station: -1 Presentation: Vertex Exam by:: Dr. Earlene Plater   A&P: 23 y.o. [name] [redacted]w[redacted]d DFM. #Labor: Progressing well. Cervical changes. AROM.  #Pain: well controlled with epidural  #FWB: Cat 1 #GBS negative #Resolved polyhydramnios    #A1GDM: EFW 2700g - CBGs q 4hrs   Denton Ar, MD 9:27 AM

## 2023-12-05 NOTE — Progress Notes (Signed)
 Labor Progress Note  Virginia Burke is a 23 y.o. [name] at [redacted]w[redacted]d presented for DFM in s/o A1GDM  S: patient comfortably in bed, no concerns, agreeable with possible placement of IUPC if no change in CVE  O:  BP 126/67   Pulse 84   Temp 99.4 F (37.4 C) (Oral)   Resp 17   Ht 4\' 8"  (1.422 m)   Wt 79.7 kg   LMP 02/27/2023 (Approximate)   SpO2 98%   BMI 39.37 kg/m  EFM:150bpm/Mild variability/ 15x15 accels/ Early decels CAT: 1 Toco: irregular, every 3-5 minutes   CVE: Dilation: Lip/rim Effacement (%): 90 Station: 0 Presentation: Vertex Exam by:: Dr. Judd Lien   A&P: 23 y.o. [name] [redacted]w[redacted]d  here for IOL as above  #Labor: Progressing well. Smallest R lateral cervical lip left.  Will plan to recheck in 2 hours or sooner if patient feeling more pressure.  #Pain: Epidural #FWB: CAT 1 #GBS negative  #Resolved polyhydramnios    #A1GDM: EFW 2700g - CBGs q 4hrs  - last CBG 76  Hessie Dibble, MD FMOB Fellow, Faculty practice [facility], Center for [facility] 12/05/23  11:34 PM

## 2023-12-05 NOTE — Progress Notes (Signed)
 Hypoglycemic Event  CBG: 65  Treatment: 8 oz juice/soda  Symptoms: None  Follow-up CBG: Time:0456 CBG Result:69  Possible Reasons for Event: Vomiting and Inadequate meal intake  Comments/MD notified:Dr.Chubb notified of pt hypoglycemia, per Dr. Judd Lien follow hypoglycemia algorithm.    Roma Schanz RN

## 2023-12-05 NOTE — Anesthesia Preprocedure Evaluation (Signed)
 Anesthesia Evaluation  Patient identified by MRN, date of birth, ID band Patient awake    Reviewed: Allergy & Precautions, H&P , NPO status , Patient's Chart, lab work & pertinent test results  Airway Mallampati: II  TM Distance: >3 FB Neck ROM: Full    Dental no notable dental hx.    Pulmonary neg pulmonary ROS   Pulmonary exam normal breath sounds clear to auscultation       Cardiovascular hypertension, negative cardio ROS Normal cardiovascular exam Rhythm:Regular Rate:Normal     Neuro/Psych negative neurological ROS  negative psych ROS   GI/Hepatic negative GI ROS, Neg liver ROS,,,  Endo/Other  negative endocrine ROS    Renal/GU negative Renal ROS  negative genitourinary   Musculoskeletal negative musculoskeletal ROS (+)    Abdominal   Peds negative pediatric ROS (+)  Hematology negative hematology ROS (+)   Anesthesia Other Findings Plt 156  Reproductive/Obstetrics (+) Pregnancy                              Anesthesia Physical Anesthesia Plan  ASA: 2  Anesthesia Plan: Epidural   Post-op Pain Management:    Induction:   PONV Risk Score and Plan: Treatment may vary due to age or medical condition  Airway Management Planned: Natural Airway  Additional Equipment:   Intra-op Plan:   Post-operative Plan:   Informed Consent: I have reviewed the patients History and Physical, chart, labs and discussed the procedure including the risks, benefits and alternatives for the proposed anesthesia with the patient or authorized representative who has indicated his/her understanding and acceptance.       Plan Discussed with: Anesthesiologist  Anesthesia Plan Comments: (Patient identified. Risks, benefits, options discussed with patient including but not limited to bleeding, infection, nerve damage, paralysis, failed block, incomplete pain control, headache, blood pressure changes,  nausea, vomiting, reactions to medication, itching, and post partum back pain. Confirmed with bedside nurse the patient's most recent platelet count. Confirmed with the patient that they are not taking any anticoagulation, have any bleeding history or any family history of bleeding disorders. Patient expressed understanding and wishes to proceed. All questions were answered. )         Anesthesia Quick Evaluation

## 2023-12-05 NOTE — Progress Notes (Signed)
 Hypoglycemic Event  CBG: 69  Treatment: 8 oz juice/soda  Symptoms: None  Follow-up CBG: Time 0622 CBG Result:67  Possible Reasons for Event: Inadequate meal intake  Comments/MD notified:Per Dr. Judd Lien, retake CBG in 1 hr and give popsicle     Roma Schanz RN

## 2023-12-05 NOTE — Anesthesia Procedure Notes (Signed)
 Epidural Patient location during procedure: OB Start time: 12/05/2023 5:45 AM End time: 12/05/2023 6:00 AM  Staffing Anesthesiologist: Bassett Nation, MD Performed: anesthesiologist   Preanesthetic Checklist Completed: patient identified, IV checked, risks and benefits discussed, monitors and equipment checked, pre-op evaluation and timeout performed  Epidural Patient position: sitting Prep: DuraPrep Patient monitoring: heart rate, cardiac monitor, continuous pulse ox and blood pressure Approach: midline Location: L3-L4 Injection technique: LOR air  Needle:  Needle type: Tuohy  Needle gauge: 17 G Needle length: 9 cm Needle insertion depth: 8 cm Catheter type: closed end flexible Catheter size: 19 Gauge Catheter at skin depth: 13 cm Test dose: negative  Assessment Sensory level: T8  Additional Notes Patient identified. Risks/Benefits/Options discussed with patient including but not limited to bleeding, infection, nerve damage, paralysis, failed block, incomplete pain control, headache, blood pressure changes, nausea, vomiting, reactions to medication both or allergic, itching and postpartum back pain. Confirmed with bedside nurse the patient's most recent platelet count. Confirmed with patient that they are not currently taking any anticoagulation, have any bleeding history or any family history of bleeding disorders. Patient expressed understanding and wished to proceed. All questions were answered. Sterile technique was used throughout the entire procedure. Please see nursing notes for vital signs. Test dose was given through epidural catheter and negative prior to continuing to dose epidural or start infusion. Warning signs of high block given to the patient including shortness of breath, tingling/numbness in hands, complete motor block, or any concerning symptoms with instructions to call for help. Patient was given instructions on fall risk and not to get out of bed. All questions  and concerns addressed with instructions to call with any issues or inadequate analgesia.  Reason for block:procedure for pain

## 2023-12-05 NOTE — Progress Notes (Signed)
 Labor Progress Note  Virginia Burke is a 23 y.o. [name] at 108w3d presented for DFM in s/o A1GDM  S: pt comfortable in bed after IV pain medications, agreeable to AROM at this time.   O:  BP 109/67   Pulse 69   Temp 97.9 F (36.6 C) (Oral)   Resp 20   Ht 4\' 8"  (1.422 m)   Wt 79.7 kg   LMP 02/27/2023 (Approximate)   SpO2 98%   BMI 39.37 kg/m  EFM:120bpm/Moderate variability/ 15x15 accels/ None decels CAT: 1 Toco: regular, every 3 minutes   CVE: Dilation: 3.5 Effacement (%): 60 Station: -2 Presentation: Vertex Exam by:: Olivia P RN   A&P: 23 y.o. [name] [redacted]w[redacted]d  here for IOL as above  #Labor: Progressing well. FB 60cc tolerated well.  #Pain: per patient request #FWB: CAT 1 #GBS negative  #Resolved polyhydramnios    #A1GDM: EFW 2700g - CBGs q 4hrs  - CBGs within the 60s despite oral sodas/juice, pt remains asymptomatic, will continue to monitor and replete as necessary, hypoglycemia protocol in place   Hessie Dibble, MD FMOB Fellow, Faculty practice [facility], Center for [facility] 12/05/23  9:18 AM

## 2023-12-05 NOTE — Progress Notes (Signed)
 LABOR PROGRESS NOTE  Patient Name: Virginia Burke, female   DOB: Oct 09, 2000, 23 y.o.  MRN: [id number]  Patient comfortable with epidural. Remains Cat I. Cervix 8.5/90/0 with more cervix maternal left. Discussed starting pitocin; patient agreeable. Start pit 2x2 with frequent position changes.  Joanne Gavel, MD

## 2023-12-06 ENCOUNTER — Encounter (HOSPITAL_COMMUNITY)
Admission: AD | Disposition: A | Discharge status: Home / Self Care | Payer: Self-pay | Source: Home / Self Care | Attending: Obstetrics & Gynecology

## 2023-12-06 ENCOUNTER — Other Ambulatory Visit: Payer: Self-pay

## 2023-12-06 ENCOUNTER — Encounter (HOSPITAL_COMMUNITY): Payer: Self-pay | Admitting: Obstetrics and Gynecology

## 2023-12-06 DIAGNOSIS — O24420 Gestational diabetes mellitus in childbirth, diet controlled: Secondary | ICD-10-CM

## 2023-12-06 DIAGNOSIS — O34211 Maternal care for low transverse scar from previous cesarean delivery: Secondary | ICD-10-CM

## 2023-12-06 DIAGNOSIS — O411230 Chorioamnionitis, third trimester, not applicable or unspecified: Secondary | ICD-10-CM

## 2023-12-06 DIAGNOSIS — Z3A38 38 weeks gestation of pregnancy: Secondary | ICD-10-CM

## 2023-12-06 DIAGNOSIS — O368130 Decreased fetal movements, third trimester, not applicable or unspecified: Secondary | ICD-10-CM

## 2023-12-06 DIAGNOSIS — O411290 Chorioamnionitis, unspecified trimester, not applicable or unspecified: Secondary | ICD-10-CM | POA: Diagnosis not present

## 2023-12-06 LAB — GLUCOSE, CAPILLARY
Glucose-Capillary: 86 mg/dL (ref 70–99)
Glucose-Capillary: 95 mg/dL (ref 70–99)

## 2023-12-06 SURGERY — Surgical Case
Anesthesia: Epidural | Site: Abdomen

## 2023-12-06 MED ORDER — OXYTOCIN-SODIUM CHLORIDE 30-0.9 UT/500ML-% IV SOLN
INTRAVENOUS | Status: DC | PRN
  Administered 2023-12-06: 300 mL via INTRAVENOUS

## 2023-12-06 MED ORDER — GABAPENTIN 300 MG PO CAPS
300.0000 mg | ORAL_CAPSULE | Freq: Two times a day (BID) | ORAL | Status: DC
  Administered 2023-12-06 – 2023-12-08 (×5): 300 mg via ORAL
  Filled 2023-12-06: qty 3
  Filled 2023-12-06 (×4): qty 1
  Filled 2023-12-06 (×2): qty 3
  Filled 2023-12-06 (×2): qty 1
  Filled 2023-12-06 (×2): qty 3

## 2023-12-06 MED ORDER — MAGNESIUM HYDROXIDE 400 MG/5ML PO SUSP: 30.0000 mL | ORAL | Status: DC | PRN

## 2023-12-06 MED ORDER — ENOXAPARIN SODIUM 40 MG/0.4ML IJ SOSY
40.0000 mg | PREFILLED_SYRINGE | INTRAMUSCULAR | Status: DC
  Administered 2023-12-06 – 2023-12-07 (×2): 40 mg via SUBCUTANEOUS
  Filled 2023-12-06 (×2): qty 0.4

## 2023-12-06 MED ORDER — OXYCODONE HCL 5 MG PO TABS
5.0000 mg | ORAL_TABLET | ORAL | Status: DC | PRN
  Administered 2023-12-07: 5 mg via ORAL
  Filled 2023-12-06: qty 1

## 2023-12-06 MED ORDER — METRONIDAZOLE 500 MG/100ML IV SOLN
500.0000 mg | Freq: Three times a day (TID) | INTRAVENOUS | Status: AC
  Administered 2023-12-06 (×3): 500 mg via INTRAVENOUS
  Filled 2023-12-06 (×4): qty 100

## 2023-12-06 MED ORDER — MENTHOL 3 MG MT LOZG: 1.0000 | LOZENGE | OROMUCOSAL | Status: DC | PRN

## 2023-12-06 MED ORDER — GENTAMICIN SULFATE 40 MG/ML IJ SOLN
5.0000 mg/kg | INTRAVENOUS | Status: DC
  Administered 2023-12-06: 300 mg via INTRAVENOUS
  Filled 2023-12-06: qty 7.5

## 2023-12-06 MED ORDER — STERILE WATER FOR IRRIGATION IR SOLN
Status: DC | PRN
  Administered 2023-12-06: 1000 mL

## 2023-12-06 MED ORDER — DIBUCAINE (PERIANAL) 1 % EX OINT: 1.0000 | TOPICAL_OINTMENT | CUTANEOUS | Status: DC | PRN

## 2023-12-06 MED ORDER — LACTATED RINGERS IV SOLN
INTRAVENOUS | Status: DC | PRN
Start: 2023-12-06 — End: 2023-12-06

## 2023-12-06 MED ORDER — AMISULPRIDE (ANTIEMETIC) 5 MG/2ML IV SOLN: 10.0000 mg | Freq: Once | INTRAVENOUS | Status: DC | PRN

## 2023-12-06 MED ORDER — MORPHINE SULFATE (PF) 0.5 MG/ML IJ SOLN
INTRAMUSCULAR | Status: AC
  Filled 2023-12-06: qty 10

## 2023-12-06 MED ORDER — PIPERACILLIN-TAZOBACTAM 3.375 G IVPB 30 MIN
3.3750 g | Freq: Three times a day (TID) | INTRAVENOUS | Status: DC
  Filled 2023-12-06 (×2): qty 50

## 2023-12-06 MED ORDER — SODIUM CHLORIDE 0.9 % IV SOLN
2.0000 g | Freq: Four times a day (QID) | INTRAVENOUS | Status: AC
  Administered 2023-12-06 – 2023-12-07 (×4): 2 g via INTRAVENOUS
  Filled 2023-12-06 (×4): qty 2000

## 2023-12-06 MED ORDER — ACETAMINOPHEN 500 MG PO TABS
1000.0000 mg | ORAL_TABLET | Freq: Four times a day (QID) | ORAL | Status: DC
  Administered 2023-12-06 – 2023-12-08 (×9): 1000 mg via ORAL
  Filled 2023-12-06 (×10): qty 2

## 2023-12-06 MED ORDER — FENTANYL CITRATE (PF) 100 MCG/2ML IJ SOLN
INTRAMUSCULAR | Status: AC
  Filled 2023-12-06: qty 2

## 2023-12-06 MED ORDER — KETOROLAC TROMETHAMINE 30 MG/ML IJ SOLN: 30.0000 mg | Freq: Once | INTRAMUSCULAR | Status: DC | PRN

## 2023-12-06 MED ORDER — FENTANYL CITRATE (PF) 100 MCG/2ML IJ SOLN: 25.0000 ug | INTRAMUSCULAR | Status: DC | PRN

## 2023-12-06 MED ORDER — FERROUS SULFATE 325 (65 FE) MG PO TABS
325.0000 mg | ORAL_TABLET | ORAL | Status: DC
  Administered 2023-12-07: 325 mg via ORAL
  Filled 2023-12-06: qty 1

## 2023-12-06 MED ORDER — SCOPOLAMINE 1 MG/3DAYS TD PT72
1.0000 | MEDICATED_PATCH | Freq: Once | TRANSDERMAL | Status: DC
  Administered 2023-12-06: 1.5 mg via TRANSDERMAL
  Filled 2023-12-06: qty 1

## 2023-12-06 MED ORDER — LIDOCAINE-EPINEPHRINE (PF) 1.5 %-1:200000 IJ SOLN
INTRAMUSCULAR | Status: DC | PRN
  Administered 2023-12-06: 5 mL via EPIDURAL

## 2023-12-06 MED ORDER — WITCH HAZEL-GLYCERIN EX PADS: 1.0000 | MEDICATED_PAD | CUTANEOUS | Status: DC | PRN

## 2023-12-06 MED ORDER — MORPHINE SULFATE (PF) 0.5 MG/ML IJ SOLN
INTRAMUSCULAR | Status: DC | PRN
  Administered 2023-12-06: 3 mg via EPIDURAL

## 2023-12-06 MED ORDER — SODIUM CHLORIDE 0.9 % IV SOLN
INTRAVENOUS | Status: AC
  Filled 2023-12-06: qty 5

## 2023-12-06 MED ORDER — ACETAMINOPHEN 10 MG/ML IV SOLN: 1000.0000 mg | Freq: Once | INTRAVENOUS | Status: DC | PRN

## 2023-12-06 MED ORDER — LIDOCAINE-EPINEPHRINE (PF) 2 %-1:200000 IJ SOLN
INTRAMUSCULAR | Status: DC | PRN
  Administered 2023-12-06: 3 mL via EPIDURAL

## 2023-12-06 MED ORDER — ZOLPIDEM TARTRATE 5 MG PO TABS: 5.0000 mg | ORAL_TABLET | Freq: Every evening | ORAL | Status: DC | PRN

## 2023-12-06 MED ORDER — DIPHENHYDRAMINE HCL 50 MG/ML IJ SOLN: 12.5000 mg | Freq: Four times a day (QID) | INTRAMUSCULAR | Status: DC | PRN

## 2023-12-06 MED ORDER — SIMETHICONE 80 MG PO CHEW: 80.0000 mg | CHEWABLE_TABLET | ORAL | Status: DC | PRN

## 2023-12-06 MED ORDER — SENNOSIDES-DOCUSATE SODIUM 8.6-50 MG PO TABS
2.0000 | ORAL_TABLET | Freq: Every day | ORAL | Status: DC
  Administered 2023-12-07 – 2023-12-08 (×2): 2 via ORAL
  Filled 2023-12-06 (×2): qty 2

## 2023-12-06 MED ORDER — DIPHENHYDRAMINE HCL 25 MG PO CAPS
25.0000 mg | ORAL_CAPSULE | Freq: Four times a day (QID) | ORAL | Status: DC | PRN
  Filled 2023-12-06: qty 1

## 2023-12-06 MED ORDER — DEXAMETHASONE SODIUM PHOSPHATE 10 MG/ML IJ SOLN
INTRAMUSCULAR | Status: DC | PRN
  Administered 2023-12-06: 10 mg via INTRAVENOUS

## 2023-12-06 MED ORDER — GENTAMICIN SULFATE 40 MG/ML IJ SOLN: 5.0000 mg/kg | INTRAVENOUS | Status: DC

## 2023-12-06 MED ORDER — PHENYLEPHRINE 80 MCG/ML (10ML) SYRINGE FOR IV PUSH (FOR BLOOD PRESSURE SUPPORT)
PREFILLED_SYRINGE | INTRAVENOUS | Status: AC
  Filled 2023-12-06: qty 10

## 2023-12-06 MED ORDER — CLINDAMYCIN PHOSPHATE 900 MG/50ML IV SOLN
INTRAVENOUS | Status: AC
  Filled 2023-12-06: qty 50

## 2023-12-06 MED ORDER — HYDROMORPHONE HCL 2 MG PO TABS: 2.0000 mg | ORAL_TABLET | ORAL | Status: DC | PRN

## 2023-12-06 MED ORDER — LACTATED RINGERS IV SOLN: INTRAVENOUS | Status: DC

## 2023-12-06 MED ORDER — TETANUS-DIPHTH-ACELL PERTUSSIS 5-2.5-18.5 LF-MCG/0.5 IM SUSY: 0.5000 mL | PREFILLED_SYRINGE | Freq: Once | INTRAMUSCULAR | Status: DC

## 2023-12-06 MED ORDER — DEXMEDETOMIDINE HCL IN NACL 80 MCG/20ML IV SOLN
INTRAVENOUS | Status: DC | PRN
  Administered 2023-12-06: 8 ug via INTRAVENOUS

## 2023-12-06 MED ORDER — TRANEXAMIC ACID-NACL 1000-0.7 MG/100ML-% IV SOLN
INTRAVENOUS | Status: DC | PRN
  Administered 2023-12-06: 1000 mg via INTRAVENOUS

## 2023-12-06 MED ORDER — NALOXONE HCL 0.4 MG/ML IJ SOLN: 0.4000 mg | INTRAMUSCULAR | Status: DC | PRN

## 2023-12-06 MED ORDER — ONDANSETRON HCL 4 MG/2ML IJ SOLN: 4.0000 mg | Freq: Three times a day (TID) | INTRAMUSCULAR | Status: DC | PRN

## 2023-12-06 MED ORDER — SODIUM CHLORIDE 0.9 % IR SOLN
Status: DC | PRN
  Administered 2023-12-06: 1

## 2023-12-06 MED ORDER — KETOROLAC TROMETHAMINE 30 MG/ML IJ SOLN
30.0000 mg | Freq: Four times a day (QID) | INTRAMUSCULAR | Status: AC
  Administered 2023-12-06 – 2023-12-07 (×4): 30 mg via INTRAVENOUS
  Filled 2023-12-06 (×4): qty 1

## 2023-12-06 MED ORDER — OXYTOCIN-SODIUM CHLORIDE 30-0.9 UT/500ML-% IV SOLN
2.5000 [IU]/h | INTRAVENOUS | Status: AC
  Administered 2023-12-06: 2.5 [IU]/h via INTRAVENOUS
  Filled 2023-12-06: qty 500

## 2023-12-06 MED ORDER — NALOXONE HCL 4 MG/10ML IJ SOLN: 1.0000 ug/kg/h | INTRAVENOUS | Status: DC | PRN

## 2023-12-06 MED ORDER — OXYCODONE HCL 5 MG PO TABS: 5.0000 mg | ORAL_TABLET | Freq: Once | ORAL | Status: DC | PRN

## 2023-12-06 MED ORDER — PHENYLEPHRINE 80 MCG/ML (10ML) SYRINGE FOR IV PUSH (FOR BLOOD PRESSURE SUPPORT)
PREFILLED_SYRINGE | INTRAVENOUS | Status: DC | PRN
  Administered 2023-12-06 (×2): 80 ug via INTRAVENOUS

## 2023-12-06 MED ORDER — IBUPROFEN 600 MG PO TABS
600.0000 mg | ORAL_TABLET | Freq: Four times a day (QID) | ORAL | Status: DC
Start: 2023-12-07 — End: 2023-12-08
  Administered 2023-12-07 – 2023-12-08 (×6): 600 mg via ORAL
  Filled 2023-12-06 (×6): qty 1

## 2023-12-06 MED ORDER — PRENATAL MULTIVITAMIN CH
1.0000 | ORAL_TABLET | Freq: Every day | ORAL | Status: DC
  Administered 2023-12-06 – 2023-12-08 (×3): 1 via ORAL
  Filled 2023-12-06 (×3): qty 1

## 2023-12-06 MED ORDER — SODIUM CHLORIDE 0.9 % IV SOLN
2.0000 g | Freq: Four times a day (QID) | INTRAVENOUS | Status: DC
  Administered 2023-12-06: 2 g via INTRAVENOUS
  Filled 2023-12-06: qty 2000

## 2023-12-06 MED ORDER — SODIUM CHLORIDE 0.9% FLUSH: 3.0000 mL | INTRAVENOUS | Status: DC | PRN

## 2023-12-06 MED ORDER — DIPHENHYDRAMINE HCL 25 MG PO CAPS
25.0000 mg | ORAL_CAPSULE | Freq: Four times a day (QID) | ORAL | Status: DC | PRN
  Administered 2023-12-06: 25 mg via ORAL

## 2023-12-06 MED ORDER — COCONUT OIL OIL
1.0000 | TOPICAL_OIL | Status: DC | PRN
  Administered 2023-12-07: 1 via TOPICAL

## 2023-12-06 MED ORDER — ONDANSETRON HCL 4 MG/2ML IJ SOLN
INTRAMUSCULAR | Status: DC | PRN
  Administered 2023-12-06: 4 mg via INTRAVENOUS

## 2023-12-06 MED ORDER — OXYCODONE HCL 5 MG/5ML PO SOLN: 5.0000 mg | Freq: Once | ORAL | Status: DC | PRN

## 2023-12-06 MED ORDER — CLINDAMYCIN PHOSPHATE 900 MG/50ML IV SOLN
INTRAVENOUS | Status: DC | PRN
Start: 2023-12-06 — End: 2023-12-06
  Administered 2023-12-06: 900 mg via INTRAVENOUS

## 2023-12-06 MED ORDER — FENTANYL CITRATE (PF) 100 MCG/2ML IJ SOLN
INTRAMUSCULAR | Status: DC | PRN
  Administered 2023-12-06: 100 ug via EPIDURAL

## 2023-12-06 MED ORDER — MEASLES, MUMPS & RUBELLA VAC IJ SOLR: 0.5000 mL | Freq: Once | INTRAMUSCULAR | Status: DC

## 2023-12-06 SURGICAL SUPPLY — 28 items
BENZOIN TINCTURE PRP APPL 2/3 (GAUZE/BANDAGES/DRESSINGS) IMPLANT
CHLORAPREP W/TINT 26 (MISCELLANEOUS) ×2 IMPLANT
DRSG OPSITE POSTOP 4X10 (GAUZE/BANDAGES/DRESSINGS) ×1 IMPLANT
ELECT REM PT RETURN 9FT ADLT (ELECTROSURGICAL) ×1 IMPLANT
ELECTRODE REM PT RTRN 9FT ADLT (ELECTROSURGICAL) ×1 IMPLANT
GAUZE SPONGE 4X4 12PLY STRL (GAUZE/BANDAGES/DRESSINGS) IMPLANT
GLOVE BIOGEL PI IND STRL 7.0 (GLOVE) ×3 IMPLANT
GLOVE ECLIPSE 7.0 STRL STRAW (GLOVE) ×1 IMPLANT
GOWN STRL REUS W/TWL LRG LVL3 (GOWN DISPOSABLE) ×1 IMPLANT
GOWN STRL REUS W/TWL XL LVL3 (GOWN DISPOSABLE) ×1 IMPLANT
KIT ABG SYR 3ML LUER SLIP (SYRINGE) IMPLANT
NDL HYPO 25X5/8 SAFETYGLIDE (NEEDLE) ×1 IMPLANT
NEEDLE HYPO 22GX1.5 SAFETY (NEEDLE) ×1 IMPLANT
NEEDLE HYPO 25X5/8 SAFETYGLIDE (NEEDLE) ×1 IMPLANT
NS IRRIG 1000ML POUR BTL (IV SOLUTION) ×1 IMPLANT
PACK C SECTION WH (CUSTOM PROCEDURE TRAY) ×1 IMPLANT
PAD ABD 7.5X8 STRL (GAUZE/BANDAGES/DRESSINGS) ×1 IMPLANT
PAD ABD 8X10 STRL (GAUZE/BANDAGES/DRESSINGS) IMPLANT
PAD OB MATERNITY 4.3X12.25 (PERSONAL CARE ITEMS) ×1 IMPLANT
RTRCTR C-SECT PINK 25CM LRG (MISCELLANEOUS) IMPLANT
STRIP CLOSURE SKIN 1/2X4 (GAUZE/BANDAGES/DRESSINGS) IMPLANT
SUT PDS AB 0 CTX 36 PDP370T (SUTURE) IMPLANT
SUT PLAIN 2 0 XLH (SUTURE) IMPLANT
SUT VIC AB 0 CTX36XBRD ANBCTRL (SUTURE) ×2 IMPLANT
SUT VIC AB 4-0 KS 27 (SUTURE) ×1 IMPLANT
SYR CONTROL 10ML LL (SYRINGE) ×1 IMPLANT
TOWEL OR 17X24 6PK STRL BLUE (TOWEL DISPOSABLE) ×1 IMPLANT
WATER STERILE IRR 1000ML POUR (IV SOLUTION) ×1 IMPLANT

## 2023-12-06 NOTE — Lactation Note (Addendum)
 This note was copied from a baby's chart. Lactation Consultation Note  Patient Name: Virginia Burke [id number] Date: 12/06/2023 Age:23 hours Reason for consult: 1st time breastfeeding;Primapara;Early term 37-38.6wks  P1, 38 wks, @ 7 hrs of life. LC rounds first thing- per mom baby not interested in breast or bottle. Reassured baby sleepy on day 1. Demonstrated hand expression, steps of latching- takes several attempts for infant to understand suckling on breast. Suck training on finger.  Infant does latch and feed on both breasts- 20 minutes overall. Swallows audible. Discussed expectations @ breast- Day 1- sleepy/ feed every 3 hrs/ even 10 minutes is okay, Day 2 more awake/ feeding cues/longer feeds, and cluster feeding overnights brings milk in. Highlighted breast stimulation is tied directly to milk production. Discussed hands on breast and baby, keeping baby awake @ breast. Starting with hand expression & breast compression to get baby working @ breast, and gentle stimulation to keep baby working @ breast. Encouraged EBM for nipple care post feed. LC services and milk storage shared. Encouraged mom to call for assist anytime desired.  Maternal Data Has patient been taught Hand Expression?: Yes Does the patient have breastfeeding experience prior to this delivery?: No  Feeding Mother's Current Feeding Choice: Breast Milk and Formula  LATCH Score    Audible Swallowing: Spontaneous and intermittent  Type of Nipple: Everted at rest and after stimulation  Comfort (Breast/Nipple): Soft / non-tender  Hold (Positioning): Full assist, staff holds infant at breast (Mom takes over hand on back on both breasts)      Lactation Tools Discussed/Used    Interventions Interventions: Breast feeding basics reviewed;Assisted with latch;Hand express;Breast compression;Expressed milk;Coconut oil;Education;LC Services brochure;CDC milk storage guidelines  Discharge Pump:  (Per mom - no pump  @ home, encouraged mom will provide hand pump for her prior to DC)  Consult Status Consult Status: Follow-up Date: 12/06/23 Follow-up type: In-patient    [facility] 12/06/2023, 9:05 AM

## 2023-12-06 NOTE — Anesthesia Postprocedure Evaluation (Signed)
 Anesthesia Post Note  Patient: Virginia Burke  Procedure(s) Performed: CESAREAN DELIVERY (Abdomen)     Patient location during evaluation: PACU Anesthesia Type: Epidural Level of consciousness: awake Pain management: pain level controlled Vital Signs Assessment: post-procedure vital signs reviewed and stable Respiratory status: spontaneous breathing, nonlabored ventilation and respiratory function stable Cardiovascular status: blood pressure returned to baseline and stable Postop Assessment: no apparent nausea or vomiting Anesthetic complications: no   No notable events documented.  Last Vitals:  Vitals:   12/06/23 0440 12/06/23 0600  BP: 111/78 112/70  Pulse: 77 93  Resp: 20 20  Temp: 37.6 C 37.2 C  SpO2: 99% 99%    Last Pain:  Vitals:   12/06/23 0615  TempSrc:   PainSc: 5    Pain Goal:                   Leonides Grills

## 2023-12-06 NOTE — Discharge Summary (Signed)
 Postpartum Discharge Summary  Date of Service updated: 12/08/23     Patient Name: Virginia Burke DOB: 10/26/2000 MRN: [id number]  Date of admission: 12/04/2023 Delivery date:12/06/2023 Delivering provider: Jaynie Collins A Date of discharge: 12/08/2023  Admitting diagnosis: Normal labor and delivery [O80] Intrauterine pregnancy: [redacted]w[redacted]d     Secondary diagnosis:  Principal Problem:   S/P cesarean section for fetal intolerance of labor Active Problems:   Chorioamnionitis, delivered, current hospitalization  Additional problems:     Discharge diagnosis: Term Pregnancy Delivered, GDM A1, and Chorio                                               Post partum procedures: none Augmentation: AROM, Pitocin, and Cytotec Complications: NRFS in s/o chorio  Hospital course: Induction of Labor With Cesarean Section   23 y.o. yo G2P1011 at [redacted]w[redacted]d was admitted to the hospital 12/04/2023 for induction of labor. Patient had a labor course significant for nonreassuring fetal status (late decelerations, recurrent) at time of pushing in s/o chorioamnionitis. The patient went for cesarean section due to Non-Reassuring FHR. Delivery details are as follows: Membrane Rupture Time/Date: 9:20 AM,12/05/2023  Delivery Method:C-Section, Low Transverse Operative Delivery:N/A Details of operation can be found in separate operative Note.  Patient had an uncomplicated postpartum course. She is ambulating, tolerating a regular diet, passing flatus, and urinating well.  Patient is discharged home in stable condition on 12/08/23.      Newborn Data: Birth date:12/06/2023 Birth time:2:04 AM Gender:Female Living status:Living Apgars:9 ,9  Weight:2770 g                               Magnesium Sulfate received: No BMZ received: No Rhophylac:N/A MMR:N/A T-DaP: no Flu: N/A RSV Vaccine received: No Transfusion:No  Immunizations received: There is no immunization history for the selected administration types on  file for this patient.  Physical exam  Vitals:   12/07/23 0820 12/07/23 1407 12/07/23 2208 12/08/23 0507  BP: 94/68 101/73 104/81 104/67  Pulse: 70 72 70 78  Resp:  18 17 18   Temp:  98 F (36.7 C) 98.1 F (36.7 C) 98.4 F (36.9 C)  TempSrc:  Oral Oral Oral  SpO2:  99% 100% 98%  Weight:      Height:       General: alert, cooperative, and no distress Lochia: appropriate Uterine Fundus: firm Incision: Dressing is clean, dry, and intact DVT Evaluation: No evidence of DVT seen on physical exam. Negative Homan's sign. Labs: Lab Results  Component Value Date   WBC 15.6 (H) 12/07/2023   HGB 9.0 (L) 12/07/2023   HCT 27.5 (L) 12/07/2023   MCV 77.5 (L) 12/07/2023   PLT 141 (L) 12/07/2023      Latest Ref Rng & Units 12/07/2023    4:54 AM  CMP  Glucose 70 - 99 mg/dL 81   BUN 6 - 20 mg/dL 13   Creatinine 7.82 - 1.00 mg/dL 9.56   Sodium 213 - 086 mmol/L 137   Potassium 3.5 - 5.1 mmol/L 4.0   Chloride 98 - 111 mmol/L 109   CO2 22 - 32 mmol/L 24   Calcium 8.9 - 10.3 mg/dL 8.2   Total Protein 6.5 - 8.1 g/dL 5.0   Total Bilirubin 0.0 - 1.2 mg/dL 0.2   Alkaline Phos  38 - 126 U/L 171   AST 15 - 41 U/L 24   ALT 0 - 44 U/L 21    Edinburgh Score:    12/07/2023   12:41 AM  Edinburgh Postnatal Depression Scale Screening Tool  I have been able to laugh and see the funny side of things. 0  I have looked forward with enjoyment to things. 1  I have blamed myself unnecessarily when things went wrong. 3  I have been anxious or worried for no good reason. 2  I have felt scared or panicky for no good reason. 0  Things have been getting on top of me. 0  I have been so unhappy that I have had difficulty sleeping. 0  I have felt sad or miserable. 0  I have been so unhappy that I have been crying. 1  The thought of harming myself has occurred to me. 0  Edinburgh Postnatal Depression Scale Total 7   Edinburgh Postnatal Depression Scale Total: 7   After visit meds:  Allergies as of  12/08/2023   No Known Allergies      Medication List     TAKE these medications    ferrous sulfate 325 (65 FE) MG tablet Take 1 tablet (325 mg total) by mouth every other day. Start taking on: December 09, 2023   ibuprofen 600 MG tablet Commonly known as: ADVIL Take 1 tablet (600 mg total) by mouth every 6 (six) hours.   norethindrone 0.35 MG tablet Commonly known as: MICRONOR Take 1 tablet (0.35 mg total) by mouth daily. Start 3-4 weeks post delivery   ondansetron 4 MG disintegrating tablet Commonly known as: ZOFRAN-ODT Take 1 tablet (4 mg total) by mouth every 6 (six) hours as needed for nausea.   oxyCODONE 5 MG immediate release tablet Commonly known as: Oxy IR/ROXICODONE Take 1-2 tablets (5-10 mg total) by mouth every 4 (four) hours as needed for moderate pain (pain score 4-6).   prenatal multivitamin Tabs tablet Take 1 tablet by mouth daily at 12 noon.         Discharge home in stable condition Infant Feeding: Breast Infant Disposition:home with mother Discharge instruction: per After Visit Summary and Postpartum booklet. Activity: Advance as tolerated. Pelvic rest for 6 weeks.  Diet: carb modified diet Future Appointments:No future appointments. Follow up Visit:  Follow-up Information     Center for [facility] at [facility]. Schedule an appointment as soon as possible for a visit in 1 week(s).   Specialty: Obstetrics and Gynecology Why: wound check Contact information: [address] [place] [address] [phone]        Center for [employer] at [facility]. Schedule an appointment as soon as possible for a visit in 5 week(s).   Specialty: Obstetrics and Gynecology Why: postpartum with 2 hour glucose test Contact information: [address] [place] [address] [phone]               [facility]   Please schedule this patient for  a In person postpartum visit in 4 weeks with the following provider: Any provider. Additional Postpartum F/U:2 hour GTT and Incision check 1 week  High risk pregnancy complicated by: GDM Delivery mode:  C-Section, Low Transverse Anticipated Birth Control:  POPs   12/08/2023 Warden Fillers, MD

## 2023-12-06 NOTE — Progress Notes (Signed)
 Labor Progress Note  Virginia Burke is a 23 y.o. [name] at 3843d presented for DFM in s/o A1GDM  S: Patient has been pushing for about 20 minutes.    O:  BP (!) 122/91   Pulse 82   Temp (!) 100.4 F (38 C) (Oral)   Resp 17   Ht 4\' 8"  (1.422 m)   Wt 79.7 kg   LMP 02/27/2023 (Approximate)   SpO2 100%   BMI 39.37 kg/m  EFM:180bpm/Mild variability/ no accels/ repetitive late decels with pushing CAT: 2 Toco: irregular, every 3-5 minutes  CVE: Dilation: 10 Effacement (%): 100 Cervical Position: Middle Station: 0 Presentation: Vertex Exam by:: Dr. Macon Large +Caput and molding, Asynclitic presentation. No descent noted after four-five sets of pushes.   A&P: 23 y.o. [name] [redacted]w[redacted]d here for IOL as above, but with worsening Category 2 FHR tracing remote from delivery and slow progress, no fetal descent.  Being treated for Triple I, receiving Amp/Gent.   Discussed situation with patient, cesarean delivery recommended.  The risks of surgery were discussed with the patient including but were not limited to: bleeding which may require transfusion or reoperation; infection which may require antibiotics; injury to bowel, bladder, ureters or other surrounding organs; injury to the fetus; need for additional procedures including hysterectomy in the event of a life-threatening hemorrhage; formation of adhesions; placental abnormalities with subsequent pregnancies; incisional problems; thromboembolic phenomenon and other postoperative/anesthesia complications.  The patient concurred with the proposed plan, giving informed written consent for the procedure.   Anesthesia and OR aware . Preoperative prophylactic antibiotics and SCDs ordered on call to the OR.  To OR when ready.   Jaynie Collins, MD [facility], Center for [facility] 12/06/23  1:27 AM

## 2023-12-06 NOTE — Transfer of Care (Signed)
 Immediate Anesthesia Transfer of Care Note  Patient: Virginia Burke  Procedure(s) Performed: CESAREAN DELIVERY (Abdomen)  Patient Location: PACU  Anesthesia Type:Epidural  Level of Consciousness: awake, alert , and oriented  Airway & Oxygen Therapy: Patient Spontanous Breathing  Post-op Assessment: Report given to RN and Post -op Vital signs reviewed and stable  Post vital signs: Reviewed and stable  Last Vitals:  Vitals Value Taken Time  BP 100/60 12/06/23 0302  Temp    Pulse 96 12/06/23 0306  Resp 21 12/06/23 0306  SpO2 99 % 12/06/23 0306  Vitals shown include unfiled device data.  Last Pain:  Vitals:   12/06/23 0031  TempSrc:   PainSc: 0-No pain         Complications: No notable events documented.

## 2023-12-06 NOTE — Op Note (Signed)
 Kennon Portela PROCEDURE DATE: 12/06/2023  PREOPERATIVE DIAGNOSES: Intrauterine pregnancy at [redacted]w[redacted]d weeks gestation; non-reassuring fetal status/fetal intolerance of labor  POSTOPERATIVE DIAGNOSES: The same  PROCEDURE: Low Transverse Cesarean Section  SURGEON:  Dr. Jaynie Collins  ASSISTANT:  Dr. Mittie Bodo. An experienced assistant was required given the standard of surgical care given the complexity of the case.  This assistant was needed for exposure, dissection, suctioning, retraction, instrument exchange, assisting with delivery with administration of fundal pressure, and for overall help during the procedure.  ANESTHESIOLOGY TEAM: Anesthesiologist: Linton Rump, MD; Bradley Ferris Catheryn Bacon, MD; Conway Nation, MD CRNA: Claudina Lick, CRNA  INDICATIONS: Virginia Burke is a 23 y.o. [phone] at [redacted]w[redacted]d here for cesarean section secondary to the indications listed under preoperative diagnoses; please see preoperative note for further details.  The risks of surgery were discussed with the patient including but were not limited to: bleeding which may require transfusion or reoperation; infection which may require antibiotics; injury to bowel, bladder, ureters or other surrounding organs; injury to the fetus; need for additional procedures including hysterectomy in the event of a life-threatening hemorrhage; formation of adhesions; placental abnormalities wth subsequent pregnancies; incisional problems; thromboembolic phenomenon and other postoperative/anesthesia complications.  The patient concurred with the proposed plan, giving informed written consent for the procedure.    FINDINGS:  Viable female infant in cephalic presentation; asynclitic/almost direct occiput position with significant molding and caput.  Apgars 9 and 9.  Clear amniotic fluid.  Intact placenta, three vessel cord.  Normal uterus, fallopian tubes and ovaries bilaterally.  ANESTHESIA: Epidural ESTIMATED BLOOD  LOSS: 365 ml URINE OUTPUT:  800 ml SPECIMENS: Placenta sent to pathology COMPLICATIONS: None immediate  PROCEDURE IN DETAIL:  The patient preoperatively received intravenous antibiotics and had sequential compression devices applied to her lower extremities.  She was then taken to the operating room where the epidural anesthesia was dosed up to surgical level and was found to be adequate. She was then placed in a dorsal supine position with a leftward tilt, and prepped and draped in a sterile manner.  A foley catheter was placed into her bladder and attached to constant gravity.  After an adequate timeout was performed, a Pfannenstiel skin incision was made with scalpel and carried through to the underlying layer of fascia. The fascia was incised in the midline, and this incision was extended bilaterally in a blunt fashion. Kocher clamps were applied to the superior aspect of the fascial incision and the underlying rectus muscles were dissected off bluntly and sharply.  The rectus muscles were separated in the midline and the peritoneum was entered bluntly. The Alexis self-retaining retractor was introduced into the abdominal cavity.  Attention was turned to the lower uterine segment where a low transverse hysterotomy was made with a scalpel and extended bilaterally bluntly.  The infant was successfully delivered, the cord was clamped and cut after one minute, and the infant was handed over to the awaiting neonatology team. Uterine massage was then administered, and the placenta delivered intact with a three-vessel cord. The uterus was then cleared of clots and debris.  The hysterotomy was closed with 0 Vicryl in a running locked fashion, and an imbricating layer was also placed with 0 Vicryl.   The pelvis was cleared of all clot and debris. Hemostasis was confirmed on all surfaces.  The retractor was removed.  The peritoneum was closed with a 0 Vicryl running stitch and the rectus muscles were reapproximated  using 0 Vicryl interrupted stitches.  The fascia was then closed using 0 PDS in a running fashion.  The subcutaneous layer was irrigated, reapproximated with 2-0 plain gut interrupted stitches, and the skin was closed with a 4-0 Vicryl subcuticular stitch. The patient tolerated the procedure well. Sponge, instrument and needle counts were correct x 3.  She was taken to the recovery room in stable condition.    Jaynie Collins, MD, FACOG Obstetrician & Gynecologist, [facility] for [employer], [facility]

## 2023-12-07 ENCOUNTER — Encounter (HOSPITAL_COMMUNITY): Payer: Self-pay | Admitting: Obstetrics and Gynecology

## 2023-12-07 LAB — COMPREHENSIVE METABOLIC PANEL
ALT: 21 U/L (ref 0–44)
AST: 24 U/L (ref 15–41)
Albumin: 2 g/dL — ABNORMAL LOW (ref 3.5–5.0)
Alkaline Phosphatase: 171 U/L — ABNORMAL HIGH (ref 38–126)
Anion gap: 4 — ABNORMAL LOW (ref 5–15)
BUN: 13 mg/dL (ref 6–20)
CO2: 24 mmol/L (ref 22–32)
Calcium: 8.2 mg/dL — ABNORMAL LOW (ref 8.9–10.3)
Chloride: 109 mmol/L (ref 98–111)
Creatinine, Ser: 0.83 mg/dL (ref 0.44–1.00)
GFR, Estimated: >60 mL/min (ref >60)
Glucose, Bld: 81 mg/dL (ref 70–99)
Potassium: 4 mmol/L (ref 3.5–5.1)
Sodium: 137 mmol/L (ref 135–145)
Total Bilirubin: 0.2 mg/dL (ref 0.0–1.2)
Total Protein: 5 g/dL — ABNORMAL LOW (ref 6.5–8.1)

## 2023-12-07 LAB — SURGICAL PATHOLOGY

## 2023-12-07 LAB — CBC
HCT: 27.5 % — ABNORMAL LOW (ref 36.0–46.0)
Hemoglobin: 9 g/dL — ABNORMAL LOW (ref 12.0–15.0)
MCH: 25.4 pg — ABNORMAL LOW (ref 26.0–34.0)
MCHC: 32.7 g/dL (ref 30.0–36.0)
MCV: 77.5 fL — ABNORMAL LOW (ref 80.0–100.0)
Platelets: 141 10*3/uL — ABNORMAL LOW (ref 150–400)
RBC: 3.55 MIL/uL — ABNORMAL LOW (ref 3.87–5.11)
RDW: 16.2 % — ABNORMAL HIGH (ref 11.5–15.5)
WBC: 15.6 10*3/uL — ABNORMAL HIGH (ref 4.0–10.5)
nRBC: 0 % (ref 0.0–0.2)

## 2023-12-07 NOTE — Lactation Note (Addendum)
 This note was copied from a baby's chart. Lactation Consultation Note  Patient Name: Virginia Burke [id number] Date: 12/07/2023 Age:23 hours Reason for consult: Follow-up assessment;1st time breastfeeding;Primapara;Early term 37-38.6wks  P1 mom of 28 hour old infant consulted for follow up. Mom voiced that baby is doing ok with breastfeeding but has now introduced supplemental formula feeding. Mom expressed that he is showing more interest in feeding but requiring supplement. LC educated on importance of breast stimulation for milk production. Discussed need to stimulate breasts when infant is not latching. Mom desired to connect to electric pump after LC reviewed options: hand expression, manual or DEBP. Set up DEBP using 21mm flanges. Recommended pumping after unsuccessful breastfeeding sessions or when infant is not going to breast. Discussed applying coconut oil to flanges for lubrication prior to pumping. Reviewed education on milk storage, cleaning, frequency, etc. Mom will initiate first pumping session after next feeding. Family present in room at [facility] visit.    Maternal Data    Feeding Mother's Current Feeding Choice: Breast Milk and Formula Nipple Type: Slow - flow  LATCH Score  Infant being fed via family member with bottle (formula). LC did not stay to see full feeding (unsure of total volume consumed)   Lactation Tools Discussed/Used Tools: Pump;Coconut oil Breast pump type: Double-Electric Breast Pump (mom's choice to use electric pump) Pump Education: Setup, frequency, and cleaning;Milk Storage Reason for Pumping: supplemental formula feeding Pumping frequency: every 3 hours for 15 minutes; especially when not placing baby to breast  Interventions Interventions: Breast feeding basics reviewed;DEBP;Education;CDC milk storage guidelines;CDC Guidelines for Breast Pump Cleaning  Discharge    Consult Status Consult Status: Follow-up Date: 12/08/23 Follow-up type:  In-patient    Virginia Burke 12/07/2023, 11:36 AM

## 2023-12-07 NOTE — Progress Notes (Signed)
 Subjective: Postpartum Day #1: Cesarean Delivery Patient reports tolerating PO, + flatus, and no problems voiding; breastfeeding going well; baby is wanting to cluster-feed; declines circumcision; denies dizziness w ambulation  Objective: Vital signs in last 24 hours: Temp:  [97.8 F (36.6 C)-98.4 F (36.9 C)] 97.8 F (36.6 C) (03/13 0545) Pulse Rate:  [68-78] 71 (03/13 0545) Resp:  [18] 18 (03/13 0545) BP: (99-121)/(68-77) 101/69 (03/13 0545) SpO2:  [99 %] 99 % (03/13 0545)  Physical Exam:  General: alert, cooperative, and no distress Lochia: appropriate Uterine Fundus: firm Incision: pressure dsg intact and dry DVT Evaluation: No evidence of DVT seen on physical exam.  Recent Labs    12/04/23 2012 12/07/23 0454  HGB 10.8* 9.0*  HCT 33.9* 27.5*    Assessment/Plan: Status post Cesarean section. Doing well postoperatively.  Continue current care. Fe every other day for expected Hgb drop post-surgery. Anticipate d/c 12/08/23.  Arabella Merles, CNM 12/07/2023, 6:33 AM

## 2023-12-08 MED ORDER — ONDANSETRON 4 MG PO TBDP
4.0000 mg | ORAL_TABLET | Freq: Four times a day (QID) | ORAL | 0 refills | Status: AC | PRN
Start: 2023-12-08 — End: ?

## 2023-12-08 MED ORDER — OXYCODONE HCL 5 MG PO TABS
5.0000 mg | ORAL_TABLET | ORAL | 0 refills | Status: AC | PRN
Start: 2023-12-08 — End: ?

## 2023-12-08 MED ORDER — FERROUS SULFATE 325 (65 FE) MG PO TABS: 325.0000 mg | ORAL_TABLET | ORAL | 1 refills | Status: AC

## 2023-12-08 MED ORDER — NORETHINDRONE 0.35 MG PO TABS: 1.0000 | ORAL_TABLET | Freq: Every day | ORAL | 8 refills | Status: AC

## 2023-12-08 MED ORDER — IBUPROFEN 600 MG PO TABS: 600.0000 mg | ORAL_TABLET | Freq: Four times a day (QID) | ORAL | 1 refills | Status: AC

## 2023-12-08 NOTE — Lactation Note (Signed)
 This note was copied from a baby's chart. Lactation Consultation Note  Patient Name: Virginia Burke [id number] Date: 12/08/2023 Age:23 hours Reason for consult: Follow-up assessment;Primapara;1st time breastfeeding;Early term 37-38.6wks  P1- [name] reports that feedings (both breast and formula) are going very well. [name] denies having any questions or concerns at this moment. [name] reports sore nipples, but no tissue breakdown. [name] has been using coconut oil to help with the sore nipples. [name] requested to be provided with a manual pump. LC provided the manual pump as well as the 21 mm flange.  LC reviewed feeding infant on cue 8-12x in 24 hrs, not allowing infant to go over 3 hrs without a feeding, day 2-4 cluster feeding, engorgement/breast care, CDC milk storage guidelines and LC services handout. LC encouraged [name] to call for further assistance as needed.  Maternal Data Has patient been taught Hand Expression?: No Does the patient have breastfeeding experience prior to this delivery?: No  Feeding Mother's Current Feeding Choice: Breast Milk and Formula  Lactation Tools Discussed/Used Tools: Pump;Flanges;Coconut oil Flange Size: 21 Breast pump type: Double-Electric Breast Pump;Manual Pump Education: Setup, frequency, and cleaning;Milk Storage Reason for Pumping: Infant low birth weight Pumping frequency: 15-20 min every 3 hrs  Interventions Interventions: Breast feeding basics reviewed;Coconut oil;Hand pump;Education;LC Services brochure  Discharge Discharge Education: Engorgement and breast care;Warning signs for feeding baby Pump: Manual;Personal  Consult Status Consult Status: Complete Date: 12/08/23    Dema Severin BS, IBCLC 12/08/2023, 11:23 AM

## 2023-12-08 NOTE — Discharge Instructions (Signed)
 Please no food or gum after midnight before your postpartum appointment

## 2023-12-15 ENCOUNTER — Other Ambulatory Visit: Payer: Self-pay

## 2023-12-15 ENCOUNTER — Ambulatory Visit: Payer: Self-pay

## 2023-12-15 VITALS — BP 116/76 | HR 76 | Ht <= 58 in | Wt 156.0 lb

## 2023-12-15 DIAGNOSIS — Z4889 Encounter for other specified surgical aftercare: Secondary | ICD-10-CM

## 2023-12-15 NOTE — Progress Notes (Signed)
 Incision Check Visit  Virginia Burke is here for incision check following primary c-section on 12/06/23.   Assessment: Incision is clean, intact and well approximated. Some moisture present along right side of incision. Advised to keep incision clean and dry by placing clean dry washcloth at incision site if needed. Patient voiced understanding.   Education: Reviewed good daily wound care and s/s of infection with patient.  Patient will follow up post partum visit on 01/17/24 at 8:35 along with PP glucose lab. Patient confirmed scheduled appointment.  Quintella Reichert, RN 12/15/2023  9:11 AM

## 2023-12-22 MED FILL — Oxytocin Inj 10 Unit/ML: INTRAMUSCULAR | Qty: 3 | Status: AC

## 2024-01-17 ENCOUNTER — Other Ambulatory Visit: Payer: Self-pay

## 2024-01-17 ENCOUNTER — Ambulatory Visit: Payer: Self-pay | Admitting: Obstetrics and Gynecology

## 2024-01-17 DIAGNOSIS — O24439 Gestational diabetes mellitus in the puerperium, unspecified control: Secondary | ICD-10-CM

## 2024-03-19 ENCOUNTER — Other Ambulatory Visit: Payer: Self-pay

## 2024-03-19 ENCOUNTER — Emergency Department (HOSPITAL_BASED_OUTPATIENT_CLINIC_OR_DEPARTMENT_OTHER)
Admission: EM | Admit: 2024-03-19 | Discharge: 2024-03-19 | Disposition: A | Discharge status: Home / Self Care | Payer: Self-pay | Attending: Emergency Medicine | Admitting: Emergency Medicine

## 2024-03-19 ENCOUNTER — Encounter (HOSPITAL_BASED_OUTPATIENT_CLINIC_OR_DEPARTMENT_OTHER): Payer: Self-pay | Admitting: Emergency Medicine

## 2024-03-19 DIAGNOSIS — B379 Candidiasis, unspecified: Secondary | ICD-10-CM | POA: Insufficient documentation

## 2024-03-19 DIAGNOSIS — I10 Essential (primary) hypertension: Secondary | ICD-10-CM | POA: Insufficient documentation

## 2024-03-19 MED ORDER — NYSTATIN 100000 UNIT/GM EX CREA: TOPICAL_CREAM | CUTANEOUS | 0 refills | Status: AC

## 2024-03-19 NOTE — ED Provider Notes (Signed)
 [place] EMERGENCY DEPARTMENT AT [facility] Provider Note   CSN: [id number] Arrival date & time: 03/19/24  1604     Patient presents with: Wound Check   Virginia Burke is a 23 y.o. female status post C-section 12/06/2023 presents for complaints for wound check.  Patient is concerned the wound is infected.  She denies any pain.  No fevers or chills.    Wound Check   Past Medical History:  Diagnosis Date   Hypertension    Past Surgical History:  Procedure Laterality Date   CESAREAN SECTION N/A 12/06/2023   Procedure: CESAREAN DELIVERY;  Surgeon: Herchel Gloris LABOR, MD;  Location: [facility] LD ORS;  Service: Obstetrics;  Laterality: N/A;   NO PAST SURGERIES         Prior to Admission medications   Medication Sig Start Date End Date Taking? Authorizing Provider  nystatin cream (MYCOSTATIN) Apply to affected area 2 times daily 03/19/24  Yes Donnajean Lynwood DEL, PA-C  ferrous sulfate  325 (65 FE) MG tablet Take 1 tablet (325 mg total) by mouth every other day. 12/09/23   Zina Jerilynn LABOR, MD  ibuprofen  (ADVIL ) 600 MG tablet Take 1 tablet (600 mg total) by mouth every 6 (six) hours. 12/08/23   Zina Jerilynn LABOR, MD  norethindrone  (MICRONOR ) 0.35 MG tablet Take 1 tablet (0.35 mg total) by mouth daily. Start 3-4 weeks post delivery 12/08/23   Zina Jerilynn LABOR, MD  ondansetron  (ZOFRAN -ODT) 4 MG disintegrating tablet Take 1 tablet (4 mg total) by mouth every 6 (six) hours as needed for nausea. Patient not taking: Reported on 12/15/2023 12/08/23   Zina Jerilynn LABOR, MD  oxyCODONE  (OXY IR/ROXICODONE ) 5 MG immediate release tablet Take 1-2 tablets (5-10 mg total) by mouth every 4 (four) hours as needed for moderate pain (pain score 4-6). 12/08/23   Zina Jerilynn LABOR, MD  Prenatal Vit-Fe Fumarate-FA (PRENATAL MULTIVITAMIN) TABS tablet Take 1 tablet by mouth daily at 12 noon.    [provider]    Allergies: Patient has no known allergies.    Review of Systems   Constitutional:  Negative for fever.    Updated Vital Signs BP 113/68 (BP Location: Right Arm)   Pulse 71   Temp 98.3 F (36.8 C)   Resp 17   SpO2 99%   Breastfeeding Yes   Physical Exam Constitutional:      Appearance: Normal appearance.  HENT:     Head: Normocephalic and atraumatic.  Pulmonary:     Effort: Pulmonary effort is normal. No respiratory distress.  Abdominal:     Comments: Abdomen is nontender, incision clean and dry without any evidence of dehiscence, there is some mild erythema involving the pannus folds without any increased warmth, induration or fluctuance   Skin:    General: Skin is warm and dry.   Neurological:     Mental Status: She is alert.   Psychiatric:        Mood and Affect: Mood normal.     (all labs ordered are listed, but only abnormal results are displayed) Labs Reviewed - No data to display  EKG: None  Radiology: No results found.   Procedures   Medications Ordered in the ED - No data to display                                  Medical Decision Making  This patient presents to the ED with chief complaint(s) of  wound check.  The complaint involves an extensive differential diagnosis and also carries with it a high risk of complications and morbidity.   Pertinent past medical history as listed in HPI  The differential diagnosis includes  Cellulitis, candidiasis, abscess, induration  Additional history obtained: Records reviewed Care Everywhere/External Records  Assessment and management:   Hemodynamically stable, nontoxic-appearing patient presenting for wound check.  She is 3 months status post cesarean section.  Denies significant pain.  On exam her incision is clean and dry without any evidence of dehiscence.  There is no induration or fluctuance.  There is some mild erythema involving her pannus folds.  No increased warmth.  Do not suspect cellulitis or abscess.  Overall her exam is most consistent with candidiasis.  Will  send in prescription for antifungal, topical.  She will follow-up with her PCP. Independent ECG interpretation:  none  Independent labs interpretation:  The following labs were independently interpreted:  none  Independent visualization and interpretation of imaging: I independently visualized the following imaging with scope of interpretation limited to determining acute life threatening conditions related to emergency care: none    Consultations obtained:   none  Disposition:   Patient will be discharged home. The patient has been appropriately medically screened and/or stabilized in the ED. I have low suspicion for any other emergent medical condition which would require further screening, evaluation or treatment in the ED or require inpatient management. At time of discharge the patient is hemodynamically stable and in no acute distress. I have discussed work-up results and diagnosis with patient and answered all questions. Patient is agreeable with discharge plan. We discussed strict return precautions for returning to the emergency department and they verbalized understanding.     Social Determinants of Health:   none  This note was dictated with voice recognition software.  Despite best efforts at proofreading, errors may have occurred which can change the documentation meaning.       Final diagnoses:  Candidiasis    ED Discharge Orders          Ordered    nystatin cream (MYCOSTATIN)        03/19/24 1629               Donnajean Lynwood DEL, PA-C 03/19/24 1632    Francesca Elsie CROME, MD 03/20/24 1505

## 2024-03-19 NOTE — ED Triage Notes (Signed)
 C- section on 12/06/2023 Concerned about scar wants to get it checked out.   Wound is closed, healing, no drainage  Scar present  Some tenderness per patient,  Denies fever

## 2024-03-19 NOTE — Discharge Instructions (Addendum)
 You were evaluated in the emergency room for wound check.  Your exam was consistent with a yeast infection.  A prescription for an antifungal was sent into your pharmacy.  Please apply to the affected area 2 times daily Follow-up with your primary care doctor to ensure your symptoms are improving.

## 2024-05-16 ENCOUNTER — Encounter (HOSPITAL_BASED_OUTPATIENT_CLINIC_OR_DEPARTMENT_OTHER): Payer: Self-pay | Admitting: Emergency Medicine

## 2024-05-16 ENCOUNTER — Emergency Department (HOSPITAL_BASED_OUTPATIENT_CLINIC_OR_DEPARTMENT_OTHER)
Admission: EM | Admit: 2024-05-16 | Discharge: 2024-05-16 | Disposition: A | Discharge status: Home / Self Care | Payer: Self-pay | Attending: Emergency Medicine | Admitting: Emergency Medicine

## 2024-05-16 ENCOUNTER — Other Ambulatory Visit: Payer: Self-pay

## 2024-05-16 DIAGNOSIS — I10 Essential (primary) hypertension: Secondary | ICD-10-CM | POA: Insufficient documentation

## 2024-05-16 DIAGNOSIS — H00015 Hordeolum externum left lower eyelid: Secondary | ICD-10-CM | POA: Insufficient documentation

## 2024-05-16 MED ORDER — ERYTHROMYCIN 5 MG/GM OP OINT: TOPICAL_OINTMENT | OPHTHALMIC | 0 refills | Status: AC

## 2024-05-16 NOTE — Discharge Instructions (Signed)
 Room compresses to the lower lid.  Using antibiotic ointment 3 times a day to the lower lid.  Should settle down on its own.  Sometimes can drain.  Return for any new or worse symptoms.

## 2024-05-16 NOTE — ED Notes (Signed)
 Reviewed AVS/discharge instruction with patient. Time allotted for and all questions answered. Patient is agreeable for d/c and escorted to ed exit by staff.

## 2024-05-16 NOTE — ED Triage Notes (Addendum)
 Left eye lower lid swelling. No vision changes. No discharge. Sclera clear.

## 2024-05-16 NOTE — ED Provider Notes (Signed)
 [place] EMERGENCY DEPARTMENT AT [facility] Provider Note   CSN: [id number] Arrival date & time: 05/16/24  2025     Patient presents with: Facial Swelling   Virginia Burke is a 23 y.o. female.   Patient with some swelling and tenderness to the left lower eyelid more medially.  Had tenderness there yesterday but no swelling.  Patient does not wear contacts.  No eye pain no visual changes.  No fevers.  Never anything like this before.  Past medical history significant for hypertension patient status post delivery was C-section in March 2025.  Patient has no concern about being pregnant currently.       Prior to Admission medications   Medication Sig Start Date End Date Taking? Authorizing Provider  erythromycin  ophthalmic ointment Place a 1/2 inch ribbon of ointment into the left lower eyelid 3 times a day. 05/16/24  Yes Natajah Derderian, MD  ferrous sulfate  325 (65 FE) MG tablet Take 1 tablet (325 mg total) by mouth every other day. 12/09/23   Zina Jerilynn LABOR, MD  ibuprofen  (ADVIL ) 600 MG tablet Take 1 tablet (600 mg total) by mouth every 6 (six) hours. 12/08/23   Zina Jerilynn LABOR, MD  norethindrone  (MICRONOR ) 0.35 MG tablet Take 1 tablet (0.35 mg total) by mouth daily. Start 3-4 weeks post delivery 12/08/23   Zina Jerilynn LABOR, MD  nystatin  cream (MYCOSTATIN ) Apply to affected area 2 times daily 03/19/24   Donnajean Lynwood DEL, PA-C  ondansetron  (ZOFRAN -ODT) 4 MG disintegrating tablet Take 1 tablet (4 mg total) by mouth every 6 (six) hours as needed for nausea. Patient not taking: Reported on 12/15/2023 12/08/23   Zina Jerilynn LABOR, MD  oxyCODONE  (OXY IR/ROXICODONE ) 5 MG immediate release tablet Take 1-2 tablets (5-10 mg total) by mouth every 4 (four) hours as needed for moderate pain (pain score 4-6). 12/08/23   Zina Jerilynn LABOR, MD  Prenatal Vit-Fe Fumarate-FA (PRENATAL MULTIVITAMIN) TABS tablet Take 1 tablet by mouth daily at 12 noon.    [provider]     Allergies: Patient has no known allergies.    Review of Systems  Constitutional:  Negative for chills and fever.  HENT:  Negative for ear pain and sore throat.   Eyes:  Positive for pain. Negative for visual disturbance.  Respiratory:  Negative for cough and shortness of breath.   Cardiovascular:  Negative for chest pain and palpitations.  Gastrointestinal:  Negative for abdominal pain and vomiting.  Genitourinary:  Negative for dysuria and hematuria.  Musculoskeletal:  Negative for arthralgias and back pain.  Skin:  Negative for color change and rash.  Neurological:  Negative for seizures and syncope.  All other systems reviewed and are negative.   Updated Vital Signs BP 116/79 (BP Location: Left Arm)   Pulse 79   Temp 98.6 F (37 C) (Oral)   Resp 16   SpO2 100%   Physical Exam Vitals and nursing note reviewed.  Constitutional:      General: She is not in acute distress.    Appearance: She is well-developed.  HENT:     Head: Normocephalic and atraumatic.  Eyes:     General: No scleral icterus.    Extraocular Movements: Extraocular movements intact.     Conjunctiva/sclera: Conjunctivae normal.     Pupils: Pupils are equal, round, and reactive to light.     Comments: Left lower lid.  With some tenderness some erythema and a bulge more medially.  Cardiovascular:     Rate and Rhythm:  Normal rate and regular rhythm.     Heart sounds: No murmur heard. Pulmonary:     Effort: Pulmonary effort is normal. No respiratory distress.     Breath sounds: Normal breath sounds.  Abdominal:     Palpations: Abdomen is soft.     Tenderness: There is no abdominal tenderness.  Musculoskeletal:        General: No swelling.     Cervical back: Neck supple.  Skin:    General: Skin is warm and dry.     Capillary Refill: Capillary refill takes less than 2 seconds.  Neurological:     General: No focal deficit present.     Mental Status: She is alert and oriented to person, place, and  time.  Psychiatric:        Mood and Affect: Mood normal.     (all labs ordered are listed, but only abnormal results are displayed) Labs Reviewed - No data to display  EKG: None  Radiology: No results found.   Procedures   Medications Ordered in the ED - No data to display                                  Medical Decision Making Risk Prescription drug management.   Consistent with a left lower lid stye.  Will treat with erythromycin  ophthalmic ointment and warm compresses.  Final diagnoses:  Hordeolum externum of left lower eyelid    ED Discharge Orders          Ordered    erythromycin  ophthalmic ointment        05/16/24 2159               Geraldene Hamilton, MD 05/16/24 2201

## 2024-08-18 ENCOUNTER — Encounter (HOSPITAL_BASED_OUTPATIENT_CLINIC_OR_DEPARTMENT_OTHER): Payer: Self-pay

## 2024-08-18 ENCOUNTER — Other Ambulatory Visit: Payer: Self-pay

## 2024-08-18 ENCOUNTER — Emergency Department (HOSPITAL_BASED_OUTPATIENT_CLINIC_OR_DEPARTMENT_OTHER)
Admission: EM | Admit: 2024-08-18 | Discharge: 2024-08-18 | Disposition: A | Discharge status: Home / Self Care | Payer: Self-pay | Attending: Emergency Medicine | Admitting: Emergency Medicine

## 2024-08-18 DIAGNOSIS — H60391 Other infective otitis externa, right ear: Secondary | ICD-10-CM | POA: Insufficient documentation

## 2024-08-18 MED ORDER — ACETAMINOPHEN 325 MG PO TABS: 650.0000 mg | ORAL_TABLET | Freq: Once | ORAL | Status: DC

## 2024-08-18 MED ORDER — CIPROFLOXACIN-DEXAMETHASONE 0.3-0.1 % OT SUSP
4.0000 [drp] | Freq: Two times a day (BID) | OTIC | Status: DC
  Administered 2024-08-18: 4 [drp] via OTIC
  Filled 2024-08-18: qty 7.5

## 2024-08-18 NOTE — ED Provider Notes (Signed)
 [place] EMERGENCY DEPARTMENT AT [facility]  Provider Note  CSN: [id number] Arrival date & time: 08/18/24 0411  History Chief Complaint  Patient presents with   Otalgia    Virginia Burke is a 23 y.o. female reports about a week of worsening R ear pain, no fever or cold symptoms, some muffled hearing. No drainage or bleeding.    Home Medications Prior to Admission medications   Medication Sig Start Date End Date Taking? Authorizing Provider  erythromycin  ophthalmic ointment Place a 1/2 inch ribbon of ointment into the left lower eyelid 3 times a day. 05/16/24   Zackowski, Scott, MD  ferrous sulfate  325 (65 FE) MG tablet Take 1 tablet (325 mg total) by mouth every other day. 12/09/23   Zina Jerilynn LABOR, MD  ibuprofen  (ADVIL ) 600 MG tablet Take 1 tablet (600 mg total) by mouth every 6 (six) hours. 12/08/23   Zina Jerilynn LABOR, MD  norethindrone  (MICRONOR ) 0.35 MG tablet Take 1 tablet (0.35 mg total) by mouth daily. Start 3-4 weeks post delivery 12/08/23   Zina Jerilynn LABOR, MD  nystatin  cream (MYCOSTATIN ) Apply to affected area 2 times daily 03/19/24   Donnajean Lynwood DEL, PA-C  ondansetron  (ZOFRAN -ODT) 4 MG disintegrating tablet Take 1 tablet (4 mg total) by mouth every 6 (six) hours as needed for nausea. Patient not taking: Reported on 12/15/2023 12/08/23   Zina Jerilynn LABOR, MD  oxyCODONE  (OXY IR/ROXICODONE ) 5 MG immediate release tablet Take 1-2 tablets (5-10 mg total) by mouth every 4 (four) hours as needed for moderate pain (pain score 4-6). 12/08/23   Zina Jerilynn LABOR, MD  Prenatal Vit-Fe Fumarate-FA (PRENATAL MULTIVITAMIN) TABS tablet Take 1 tablet by mouth daily at 12 noon.    [provider]     Allergies    Patient has no known allergies.   Review of Systems   Review of Systems Please see HPI for pertinent positives and negatives  Physical Exam BP 118/74 (BP Location: Right Arm)   Pulse 78   Temp 97.9 F (36.6 C) (Oral)   Resp 17   Ht 4'  9 (1.448 m)   Wt 80.3 kg   LMP  (Approximate)   SpO2 98%   Breastfeeding Yes   BMI 38.30 kg/m   Physical Exam Vitals and nursing note reviewed.  HENT:     Head: Normocephalic.     Right Ear: Tympanic membrane normal.     Left Ear: Tympanic membrane normal.     Ears:     Comments: R EAC with erythema and edema, but no totally closed    Nose: Nose normal.  Eyes:     Extraocular Movements: Extraocular movements intact.  Pulmonary:     Effort: Pulmonary effort is normal.  Musculoskeletal:        General: Normal range of motion.     Cervical back: Neck supple.  Skin:    Findings: No rash (on exposed skin).  Neurological:     Mental Status: She is alert and oriented to person, place, and time.  Psychiatric:        Mood and Affect: Mood normal.     ED Results / Procedures / Treatments   EKG None  Procedures Procedures  Medications Ordered in the ED Medications  acetaminophen  (TYLENOL ) tablet 650 mg (has no administration in time range)  ciprofloxacin -dexamethasone  (CIPRODEX ) 0.3-0.1 % OTIC (EAR) suspension 4 drop (has no administration in time range)    Initial Impression and Plan  Patient here with R ear  pain and exam consistent with uncomplicated OE. Will give Ciprodex , APAP/Motrin  for pain. PCP follow up, RTED for any other concerns.    ED Course       MDM Rules/Calculators/A&P Medical Decision Making Problems Addressed: Other infective acute otitis externa of right ear: acute illness or injury  Risk OTC drugs. Prescription drug management.     Final Clinical Impression(s) / ED Diagnoses Final diagnoses:  Other infective acute otitis externa of right ear    Rx / DC Orders ED Discharge Orders     None        Roselyn Carlin NOVAK, MD 08/18/24 [phone]

## 2024-08-18 NOTE — Discharge Instructions (Signed)
 Please use the ear drops as directed (4 drops in right ear twice daily for 7 days).

## 2024-08-18 NOTE — ED Triage Notes (Signed)
 PT to exam 12 c/o right Ear pain 10/10 pressure in nature x 1 week. Pt denies fever chills cough. VSS NAD PT on room air.

## 2024-08-19 ENCOUNTER — Emergency Department (HOSPITAL_BASED_OUTPATIENT_CLINIC_OR_DEPARTMENT_OTHER)
Admission: EM | Admit: 2024-08-19 | Discharge: 2024-08-20 | Disposition: A | Discharge status: Home / Self Care | Payer: Self-pay | Attending: Emergency Medicine | Admitting: Emergency Medicine

## 2024-08-19 ENCOUNTER — Other Ambulatory Visit: Payer: Self-pay

## 2024-08-19 DIAGNOSIS — I10 Essential (primary) hypertension: Secondary | ICD-10-CM | POA: Insufficient documentation

## 2024-08-19 DIAGNOSIS — H7091 Unspecified mastoiditis, right ear: Secondary | ICD-10-CM | POA: Insufficient documentation

## 2024-08-19 MED ORDER — KETOROLAC TROMETHAMINE 15 MG/ML IJ SOLN
15.0000 mg | Freq: Once | INTRAMUSCULAR | Status: AC
  Administered 2024-08-20: 15 mg via INTRAVENOUS
  Filled 2024-08-19: qty 1

## 2024-08-19 NOTE — ED Triage Notes (Signed)
 Pt POV reporting persistent R ear pain r/t ear infection, seen yesterday for same, given ear drops with no improvement.

## 2024-08-20 ENCOUNTER — Emergency Department (HOSPITAL_BASED_OUTPATIENT_CLINIC_OR_DEPARTMENT_OTHER): Payer: Self-pay

## 2024-08-20 LAB — BASIC METABOLIC PANEL WITH GFR
Anion gap: 10 (ref 5–15)
BUN: 8 mg/dL (ref 6–20)
CO2: 26 mmol/L (ref 22–32)
Calcium: 9.6 mg/dL (ref 8.9–10.3)
Chloride: 105 mmol/L (ref 98–111)
Creatinine, Ser: 0.48 mg/dL (ref 0.44–1.00)
GFR, Estimated: >60 mL/min (ref >60)
Glucose, Bld: 99 mg/dL (ref 70–99)
Potassium: 3.8 mmol/L (ref 3.5–5.1)
Sodium: 141 mmol/L (ref 135–145)

## 2024-08-20 LAB — CBC
HCT: 39.3 % (ref 36.0–46.0)
Hemoglobin: 13 g/dL (ref 12.0–15.0)
MCH: 27.4 pg (ref 26.0–34.0)
MCHC: 33.1 g/dL (ref 30.0–36.0)
MCV: 82.9 fL (ref 80.0–100.0)
Platelets: 225 K/uL (ref 150–400)
RBC: 4.74 MIL/uL (ref 3.87–5.11)
RDW: 13.2 % (ref 11.5–15.5)
WBC: 8.8 K/uL (ref 4.0–10.5)
nRBC: 0 % (ref 0.0–0.2)

## 2024-08-20 LAB — HCG, SERUM, QUALITATIVE: Preg, Serum: NEGATIVE

## 2024-08-20 MED ORDER — AMOXICILLIN-POT CLAVULANATE 875-125 MG PO TABS: 1.0000 | ORAL_TABLET | Freq: Two times a day (BID) | ORAL | 0 refills | Status: DC

## 2024-08-20 MED ORDER — CIPROFLOXACIN HCL 500 MG PO TABS: 500.0000 mg | ORAL_TABLET | Freq: Two times a day (BID) | ORAL | 0 refills | Status: AC

## 2024-08-20 MED ORDER — AMOXICILLIN-POT CLAVULANATE 875-125 MG PO TABS
1.0000 | ORAL_TABLET | Freq: Once | ORAL | Status: AC
  Administered 2024-08-20: 1 via ORAL
  Filled 2024-08-20: qty 1

## 2024-08-20 MED ORDER — IOHEXOL 300 MG/ML  SOLN
100.0000 mL | Freq: Once | INTRAMUSCULAR | Status: AC | PRN
  Administered 2024-08-20: 75 mL via INTRAVENOUS

## 2024-08-20 MED ORDER — CIPROFLOXACIN-DEXAMETHASONE 0.3-0.1 % OT SUSP: 4.0000 [drp] | Freq: Two times a day (BID) | OTIC | 0 refills | Status: AC

## 2024-08-20 NOTE — Discharge Instructions (Addendum)
 You were evaluated in the Emergency Department and after careful evaluation, we did not find any emergent condition requiring admission or further testing in the hospital.  Symptoms seem to be due to an ear infection with some extension to the mastoid bone of the skull.  This is known as mastoiditis.  Very important that we change your antibiotics and that you follow-up with the ENT specialist in the office.  Call the number to schedule an appointment.  Take the ciprofloxacin  antibiotic pill twice daily, stop your current eardrops and use the Ciprodex  drops twice daily as well.  Avoid any moisture getting into your ear.  Please return to the Emergency Department if you experience any worsening of your condition.   Thank you for allowing us  to be a part of your care.

## 2024-08-20 NOTE — ED Provider Notes (Signed)
 [facility] Emergency Department Provider Note MRN:  [id number]  Arrival date & time: 08/20/24     Chief Complaint   Otalgia   History of Present Illness   Virginia Burke is a 23 y.o. year-old female with a history of hypertension presenting to the ED with chief complaint of ear pain.  Persistent right-sided ear pain for the past 6 days.  Has been on eardrops the past 24 hours.  Seems to worsening, now has spread to the bone behind the ear.  Denies fever.  Review of Systems  A thorough review of systems was obtained and all systems are negative except as noted in the HPI and PMH.   Patient's Health History    Past Medical History:  Diagnosis Date   Hypertension     Past Surgical History:  Procedure Laterality Date   CESAREAN SECTION N/A 12/06/2023   Procedure: CESAREAN DELIVERY;  Surgeon: Herchel Gloris LABOR, MD;  Location: [facility] LD ORS;  Service: Obstetrics;  Laterality: N/A;   NO PAST SURGERIES      Family History  Problem Relation Age of Onset   Diabetes Mother    Diabetes Father    Diabetes Maternal Grandmother    Diabetes Maternal Grandfather    Diabetes Paternal Grandmother    Diabetes Paternal Grandfather     Social History   Socioeconomic History   Marital status: Significant Other    Spouse name: Not on file   Number of children: Not on file   Years of education: Not on file   Highest education level: Not on file  Occupational History   Not on file  Tobacco Use   Smoking status: Never   Smokeless tobacco: Never  Vaping Use   Vaping status: Former   Substances: Nicotine  Substance and Sexual Activity   Alcohol use: Never   Drug use: Never   Sexual activity: Not Currently  Other Topics Concern   Not on file  Social History Narrative   Not on file   Social Drivers of Health   Financial Resource Strain: Not on file  Food Insecurity: No Food Insecurity (12/04/2023)   Hunger Vital Sign    Worried About Running  Out of Food in the Last Year: Never true    Ran Out of Food in the Last Year: Never true  Transportation Needs: No Transportation Needs (12/04/2023)   PRAPARE - Administrator, Civil Service (Medical): No    Lack of Transportation (Non-Medical): No  Physical Activity: Not on file  Stress: Not on file  Social Connections: Not on file  Intimate Partner Violence: Not At Risk (12/04/2023)   Humiliation, Afraid, Rape, and Kick questionnaire    Fear of Current or Ex-Partner: No    Emotionally Abused: No    Physically Abused: No    Sexually Abused: No     Physical Exam   Vitals:   08/20/24 0130 08/20/24 0200  BP: 115/64 112/69  Pulse: 76 81  Resp:    Temp:    SpO2: 100% 100%    CONSTITUTIONAL: Well-appearing, NAD NEURO/PSYCH:  Alert and oriented x 3, no focal deficits EYES:  eyes equal and reactive ENT/NECK:  no LAD, no JVD CARDIO: Regular rate, well-perfused, normal S1 and S2 PULM:  CTAB no wheezing or rhonchi GI/GU:  non-distended, non-tender MSK/SPINE:  No gross deformities, no edema SKIN:  no rash, atraumatic   *Additional and/or pertinent findings included in MDM below  Diagnostic and Interventional Summary  EKG Interpretation Date/Time:    Ventricular Rate:    PR Interval:    QRS Duration:    QT Interval:    QTC Calculation:   R Axis:      Text Interpretation:         Labs Reviewed  CBC  BASIC METABOLIC PANEL WITH GFR  HCG, SERUM, QUALITATIVE    CT Temporal Bones W Contrast  Final Result      Medications  amoxicillin -clavulanate (AUGMENTIN ) 875-125 MG per tablet 1 tablet (has no administration in time range)  ketorolac  (TORADOL ) 15 MG/ML injection 15 mg (15 mg Intravenous Given 08/20/24 0022)  iohexol  (OMNIPAQUE ) 300 MG/ML solution 100 mL (75 mLs Intravenous Contrast Given 08/20/24 0129)     Procedures  /  Critical Care Procedures  ED Course and Medical Decision Making  Initial Impression and Ddx Patient has a lot of pain with  otoscopic exam, pain with movement of the pinna, the external canal seems inflamed.  All seems consistent with otitis externa.  She also has tenderness to the mastoid raising consideration or concern for mastoiditis or malignant otitis externa.  No other complaints.  Past medical/surgical history that increases complexity of ED encounter: None  Interpretation of Diagnostics I personally reviewed the Laboratory Testing and my interpretation is as follows: No significant blood count or electrolyte disturbance.  CT confirms otomastoiditis  Patient Reassessment and Ultimate Disposition/Management     Case discussed with Dr. Lionel of ENT.  Given the patient has no fever, no leukocytosis, she is well-appearing, pain controlled, there is no evidence of abscess on the CT, she can be managed outpatient with oral antibiotics, will change her drops to Ciprodex .  Will follow-up in the office.  Patient management required discussion with the following services or consulting groups:  ENT/Plastic Surgery  Complexity of Problems Addressed Acute illness or injury that poses threat of life of bodily function  Additional Data Reviewed and Analyzed Further history obtained from: Further history from spouse/family member  Additional Factors Impacting ED Encounter Risk Prescriptions and Consideration of hospitalization  Ozell HERO. Theadore, MD [facility] Emergency Medicine [facility] [email or web address]  Final Clinical Impressions(s) / ED Diagnoses     ICD-10-CM   1. Mastoiditis of right side  H70.91       ED Discharge Orders          Ordered    ciprofloxacin -dexamethasone  (CIPRODEX ) OTIC suspension  2 times daily        08/20/24 0255    amoxicillin -clavulanate (AUGMENTIN ) 875-125 MG tablet  Every 12 hours        08/20/24 0255             Discharge Instructions Discussed with and Provided to Patient:     Discharge Instructions      You were evaluated in the  Emergency Department and after careful evaluation, we did not find any emergent condition requiring admission or further testing in the hospital.  Symptoms seem to be due to an ear infection with some extension to the mastoid bone of the skull.  This is known as mastoiditis.  Very important that we change your antibiotics and that you follow-up with the ENT specialist in the office.  Call the number to schedule an appointment.  Take the Augmentin  antibiotic pill twice daily, stop your current eardrops and use the Ciprodex  drops twice daily as well.  Please return to the Emergency Department if you experience any worsening of your condition.   Thank you for allowing  us  to be a part of your care.       Theadore Ozell HERO, MD 08/20/24 [phone]

## 2024-09-20 ENCOUNTER — Emergency Department (HOSPITAL_BASED_OUTPATIENT_CLINIC_OR_DEPARTMENT_OTHER)
Admission: EM | Admit: 2024-09-20 | Discharge: 2024-09-21 | Disposition: A | Discharge status: Home / Self Care | Payer: Self-pay | Attending: Emergency Medicine | Admitting: Emergency Medicine

## 2024-09-20 ENCOUNTER — Emergency Department (HOSPITAL_BASED_OUTPATIENT_CLINIC_OR_DEPARTMENT_OTHER): Payer: Self-pay | Admitting: Radiology

## 2024-09-20 ENCOUNTER — Encounter (HOSPITAL_BASED_OUTPATIENT_CLINIC_OR_DEPARTMENT_OTHER): Payer: Self-pay

## 2024-09-20 ENCOUNTER — Other Ambulatory Visit: Payer: Self-pay

## 2024-09-20 DIAGNOSIS — R0789 Other chest pain: Secondary | ICD-10-CM

## 2024-09-20 DIAGNOSIS — R0781 Pleurodynia: Secondary | ICD-10-CM

## 2024-09-20 DIAGNOSIS — O9279 Other disorders of lactation: Secondary | ICD-10-CM | POA: Insufficient documentation

## 2024-09-20 DIAGNOSIS — R0602 Shortness of breath: Secondary | ICD-10-CM | POA: Insufficient documentation

## 2024-09-20 NOTE — ED Triage Notes (Signed)
 PT reports R sided thoracic pain since yesterday. Pt reports pain increases on inspiration and movement. Pt denies any cough or flu like s/s

## 2024-09-21 ENCOUNTER — Emergency Department (HOSPITAL_BASED_OUTPATIENT_CLINIC_OR_DEPARTMENT_OTHER): Payer: Self-pay

## 2024-09-21 ENCOUNTER — Other Ambulatory Visit: Payer: Self-pay

## 2024-09-21 LAB — BASIC METABOLIC PANEL WITH GFR
Anion gap: 11 (ref 5–15)
BUN: 8 mg/dL (ref 6–20)
CO2: 26 mmol/L (ref 22–32)
Calcium: 9.9 mg/dL (ref 8.9–10.3)
Chloride: 104 mmol/L (ref 98–111)
Creatinine, Ser: 0.47 mg/dL (ref 0.44–1.00)
GFR, Estimated: >60 mL/min
Glucose, Bld: 90 mg/dL (ref 70–99)
Potassium: 4.5 mmol/L (ref 3.5–5.1)
Sodium: 141 mmol/L (ref 135–145)

## 2024-09-21 LAB — CBC WITH DIFFERENTIAL/PLATELET
Abs Immature Granulocytes: 0.03 K/uL (ref 0.00–0.07)
Basophils Absolute: 0 K/uL (ref 0.0–0.1)
Basophils Relative: 1 %
Eosinophils Absolute: 0.2 K/uL (ref 0.0–0.5)
Eosinophils Relative: 2 %
HCT: 41.7 % (ref 36.0–46.0)
Hemoglobin: 13.6 g/dL (ref 12.0–15.0)
Immature Granulocytes: 0 %
Lymphocytes Relative: 41 %
Lymphs Abs: 3.4 K/uL (ref 0.7–4.0)
MCH: 26.9 pg (ref 26.0–34.0)
MCHC: 32.6 g/dL (ref 30.0–36.0)
MCV: 82.6 fL (ref 80.0–100.0)
Monocytes Absolute: 0.6 K/uL (ref 0.1–1.0)
Monocytes Relative: 7 %
Neutro Abs: 4.1 K/uL (ref 1.7–7.7)
Neutrophils Relative %: 49 %
Platelets: 246 K/uL (ref 150–400)
RBC: 5.05 MIL/uL (ref 3.87–5.11)
RDW: 13 % (ref 11.5–15.5)
WBC: 8.3 K/uL (ref 4.0–10.5)
nRBC: 0 % (ref 0.0–0.2)

## 2024-09-21 LAB — TROPONIN T, HIGH SENSITIVITY: Troponin T High Sensitivity: <15 ng/L (ref 0–19)

## 2024-09-21 LAB — RESP PANEL BY RT-PCR (RSV, FLU A&B, COVID)  RVPGX2
Influenza A by PCR: NEGATIVE
Influenza B by PCR: NEGATIVE
Resp Syncytial Virus by PCR: NEGATIVE
SARS Coronavirus 2 by RT PCR: NEGATIVE

## 2024-09-21 MED ORDER — SODIUM CHLORIDE 0.9 % IV BOLUS
1000.0000 mL | Freq: Once | INTRAVENOUS | Status: AC
  Administered 2024-09-21: 1000 mL via INTRAVENOUS

## 2024-09-21 MED ORDER — MORPHINE SULFATE (PF) 4 MG/ML IV SOLN
4.0000 mg | Freq: Once | INTRAVENOUS | Status: AC
  Administered 2024-09-21: 4 mg via INTRAVENOUS
  Filled 2024-09-21: qty 1

## 2024-09-21 MED ORDER — ALUM & MAG HYDROXIDE-SIMETH 200-200-20 MG/5ML PO SUSP
30.0000 mL | Freq: Once | ORAL | Status: AC
  Administered 2024-09-21: 30 mL via ORAL
  Filled 2024-09-21: qty 30

## 2024-09-21 MED ORDER — KETOROLAC TROMETHAMINE 15 MG/ML IJ SOLN
15.0000 mg | Freq: Once | INTRAMUSCULAR | Status: AC
  Administered 2024-09-21: 15 mg via INTRAVENOUS
  Filled 2024-09-21: qty 1

## 2024-09-21 MED ORDER — LIDOCAINE VISCOUS HCL 2 % MT SOLN
15.0000 mL | Freq: Once | OROMUCOSAL | Status: AC
  Administered 2024-09-21: 15 mL via ORAL
  Filled 2024-09-21 (×2): qty 15

## 2024-09-21 MED ORDER — ONDANSETRON 4 MG PO TBDP
4.0000 mg | ORAL_TABLET | Freq: Once | ORAL | Status: AC
  Administered 2024-09-21: 4 mg via ORAL
  Filled 2024-09-21: qty 1

## 2024-09-21 MED ORDER — LIDOCAINE 5 % EX PTCH
1.0000 | MEDICATED_PATCH | CUTANEOUS | Status: DC
  Administered 2024-09-21: 1 via TRANSDERMAL
  Filled 2024-09-21: qty 1

## 2024-09-21 MED ORDER — IOHEXOL 350 MG/ML SOLN
75.0000 mL | Freq: Once | INTRAVENOUS | Status: AC | PRN
  Administered 2024-09-21: 75 mL via INTRAVENOUS

## 2024-09-21 NOTE — ED Notes (Signed)
 Pt able to express from Left breast with manual pump, reports feeling better.

## 2024-09-21 NOTE — ED Provider Notes (Signed)
 " [place] EMERGENCY DEPARTMENT AT [facility] Provider Note   CSN: [id number] Arrival date & time: 09/20/24  1736     Patient presents with: Chest Pain   Virginia Burke is a 23 y.o. female.    Chest Pain    23 year old female presenting to the emergency department with a chief complaint of 2 days of chest pain.  The patient endorses mild shortness of breath.  She has a family member with a flulike illness.  She denies any cough, denies any fevers or chills.  She is 9 months postpartum and is still currently breast-feeding.  She endorses some chest discomfort in the substernal area with some pleuritic component.  No history of DVT or PE, no swelling in the lower extremities note, no cramping in the lower extremities, no recent long travel, she is not on estrogen-containing birth control and denies any hemoptysis.  Prior to Admission medications  Medication Sig Start Date End Date Taking? Authorizing Provider  erythromycin  ophthalmic ointment Place a 1/2 inch ribbon of ointment into the left lower eyelid 3 times a day. 05/16/24   Zackowski, Scott, MD  ferrous sulfate  325 (65 FE) MG tablet Take 1 tablet (325 mg total) by mouth every other day. 12/09/23   Zina Jerilynn LABOR, MD  ibuprofen  (ADVIL ) 600 MG tablet Take 1 tablet (600 mg total) by mouth every 6 (six) hours. 12/08/23   Zina Jerilynn LABOR, MD  norethindrone  (MICRONOR ) 0.35 MG tablet Take 1 tablet (0.35 mg total) by mouth daily. Start 3-4 weeks post delivery 12/08/23   Zina Jerilynn LABOR, MD  nystatin  cream (MYCOSTATIN ) Apply to affected area 2 times daily 03/19/24   Donnajean Lynwood DEL, PA-C  ondansetron  (ZOFRAN -ODT) 4 MG disintegrating tablet Take 1 tablet (4 mg total) by mouth every 6 (six) hours as needed for nausea. Patient not taking: Reported on 12/15/2023 12/08/23   Zina Jerilynn LABOR, MD  oxyCODONE  (OXY IR/ROXICODONE ) 5 MG immediate release tablet Take 1-2 tablets (5-10 mg total) by mouth every 4 (four) hours as  needed for moderate pain (pain score 4-6). 12/08/23   Zina Jerilynn LABOR, MD  Prenatal Vit-Fe Fumarate-FA (PRENATAL MULTIVITAMIN) TABS tablet Take 1 tablet by mouth daily at 12 noon.    [provider]    Allergies: Patient has no known allergies.    Review of Systems  Cardiovascular:  Positive for chest pain.  All other systems reviewed and are negative.   Updated Vital Signs BP 117/78   Pulse 97   Temp 98.1 F (36.7 C) (Oral)   Resp 13   Ht 4' 9 (1.448 m)   Wt 78 kg   SpO2 100%   Breastfeeding Yes   BMI 37.22 kg/m   Physical Exam Vitals and nursing note reviewed.  Constitutional:      General: She is not in acute distress.    Appearance: She is well-developed.  HENT:     Head: Normocephalic and atraumatic.  Eyes:     Conjunctiva/sclera: Conjunctivae normal.  Cardiovascular:     Rate and Rhythm: Normal rate and regular rhythm.     Heart sounds: No murmur heard. Pulmonary:     Effort: Pulmonary effort is normal. No respiratory distress.     Breath sounds: Normal breath sounds.  Chest:     Comments: No chest wall tenderness on exam Abdominal:     Palpations: Abdomen is soft.     Tenderness: There is no abdominal tenderness.     Comments: No abdominal tenderness  Musculoskeletal:  General: No swelling.     Cervical back: Neck supple.  Skin:    General: Skin is warm and dry.     Capillary Refill: Capillary refill takes less than 2 seconds.  Neurological:     Mental Status: She is alert.  Psychiatric:        Mood and Affect: Mood normal.     (all labs ordered are listed, but only abnormal results are displayed) Labs Reviewed  RESP PANEL BY RT-PCR (RSV, FLU A&B, COVID)  [id number]  CBC WITH DIFFERENTIAL/PLATELET  BASIC METABOLIC PANEL WITH GFR  TROPONIN T, HIGH SENSITIVITY    EKG: None   Radiology: CT Angio Chest PE W and/or Wo Contrast Result Date: 09/21/2024 EXAM: CTA of the Chest with contrast for PE 09/21/2024 04:12:09 AM TECHNIQUE: CTA  of the chest was performed after the administration of intravenous contrast. Contrast: Without and with IV. Contrast dosage: 75 mL iohexol  (OMNIPAQUE ) 350 MG/ML injection. Multiplanar reformatted images are provided for review. MIP images are provided for review. Automated exposure control, iterative reconstruction, and/or weight based adjustment of the mA/kV was utilized to reduce the radiation dose to as low as reasonably achievable. COMPARISON: Chest radiographs 09/20/2024. CLINICAL HISTORY: 23 year old female with right-sided chest pain, pleuritic pain, and suspected high probability pulmonary embolism. FINDINGS: PULMONARY ARTERIES: Adequate pulmonary artery contrast timing. No pulmonary embolism. Main pulmonary artery is normal in caliber. MEDIASTINUM: The heart and pericardium demonstrate no acute abnormality. No pericardial effusion. There is no acute abnormality of the thoracic aorta. Negative visible aorta. LYMPH NODES: No mediastinal, hilar or axillary lymphadenopathy. Mediastinal lymph nodes are within normal limits. Axillary lymph nodes are within normal limits. LUNGS AND PLEURA: Major airways are patent. Low lung volumes. Symmetric bilateral atelectasis with superimposed mildly elevated right hemidiaphragm. Crowding of lung markings but no consolidation or pleural effusion. No convincing focal pulmonary inflammation. No pneumothorax. UPPER ABDOMEN: Limited images of the upper abdomen demonstrate negative visible early postcontrast appearance of the liver, gallbladder, spleen, pancreas, adrenal glands, kidneys, and bowel. SOFT TISSUES AND BONES: No acute bone or soft tissue abnormality. IMPRESSION: 1. No acute pulmonary embolism. 2. Low lung volumes with atelectasis. No other acute or inflammatory process identified in the Chest. Electronically signed by: Helayne Hurst MD 09/21/2024 04:33 AM EST RP Workstation: [id number]   DG Chest 2 View Result Date: 09/20/2024 EXAM: 2 VIEW(S) XRAY OF THE CHEST  09/20/2024 06:01:15 PM COMPARISON: Comparison date chest x ray 02/03/2015. CLINICAL HISTORY: chest wall pain FINDINGS: LUNGS AND PLEURA: No focal pulmonary opacity. No pleural effusion. No pneumothorax. HEART AND MEDIASTINUM: No acute abnormality of the cardiac and mediastinal silhouettes. BONES AND SOFT TISSUES: No acute osseous abnormality. IMPRESSION: 1. No acute process. Electronically signed by: Greig Pique MD 09/20/2024 07:15 PM EST RP Workstation: [id number]     Procedures   Medications Ordered in the ED  ketorolac  (TORADOL ) 15 MG/ML injection 15 mg (15 mg Intravenous Given 09/21/24 0113)  alum & mag hydroxide-simeth (MAALOX/MYLANTA) 200-200-20 MG/5ML suspension 30 mL (30 mLs Oral Given 09/21/24 0247)    And  lidocaine  (XYLOCAINE ) 2 % viscous mouth solution 15 mL (15 mLs Oral Given 09/21/24 0247)  morphine  (PF) 4 MG/ML injection 4 mg (4 mg Intravenous Given 09/21/24 0248)  iohexol  (OMNIPAQUE ) 350 MG/ML injection 75 mL (75 mLs Intravenous Contrast Given 09/21/24 0412)  sodium chloride  0.9 % bolus 1,000 mL (0 mLs Intravenous Stopped 09/21/24 0556)  ondansetron  (ZOFRAN -ODT) disintegrating tablet 4 mg (4 mg Oral Given 09/21/24 0556)  Medical Decision Making Amount and/or Complexity of Data Reviewed Labs: ordered. Radiology: ordered.  Risk OTC drugs. Prescription drug management.    23 year old female presenting to the emergency department with a chief complaint of 2 days of chest pain.  The patient endorses mild shortness of breath.  She has a family member with a flulike illness.  She denies any cough, denies any fevers or chills.  She is 9 months postpartum and is still currently breast-feeding.  She endorses some chest discomfort in the substernal area with some pleuritic component.  No history of DVT or PE, no swelling in the lower extremities note, no cramping in the lower extremities, no recent long travel, she is not on estrogen-containing  birth control and denies any hemoptysis.  On arrival, the patient was afebrile, not tachycardic or tachypneic, hemodynamically stable, saturating well on room air.  Differential diagnosis of the patient's pain is broad and includes musculoskeletal pain, less likely mastitis, less likely breast abscess or cellulitis,, less likely zoster, less likely DVT or PE, ACS, pneumonia, viral infection, pneumothorax.  Initial EKG revealed sinus rhythm, ventricular rate 81, no ST changes, no abnormal intervals.  The chest x-ray is performed which revealed no acute cardiac or pulmonary abnormality  Labs: CBC without a leukocytosis or anemia, BMP unremarkable, COVID flu and RSV PCR testing negative, cardiac troponins x 2 negative.  Patient was initially administered Toradol , lidocaine  patch, endorses persistent symptoms, discussed with her risk of opiates while breast-feeding, will administer one-time dose of morphine .  Patient reassessed and still with ongoing pain and tenderness.  Blood pressure is mildly trended down to soft, administered NaCl bolus with improvement.  Patient endorsing nonspecific cough, chest discomfort, heart rates trending above 100, will obtain further evaluation with CTA PE study.  Cardiac troponins x 2 negative, low concern for ACS.  Patient without abdominal tenderness in the right upper quadrant.  CTA PE: IMPRESSION:  1. No acute pulmonary embolism.  2. Low lung volumes with atelectasis. No other acute or inflammatory process  identified in the Chest.   Fairly unremarkable workup at this time however patient still endorsing persistent symptoms, states that her pain is completely unchanged.  The patient is currently breast-feeding.  She does not currently pump.  She has never had a problem with breast engorgement.  A bedside exam was performed with no evidence of mastitis, no evidence of abscess.  Patient possibly with breast engorgement resulting in pain and discomfort.  I advised the  patient to attempt to express while in the emergency department.  Following this, the patient was feeling symptomatically improved.  Advised that the patient trial pumping at home in addition to breast-feeding given her presentation today.  Overall reassuring workup, stable for discharge and outpatient PCP follow-up.     Final diagnoses:  Chest wall pain  Pleuritic chest pain  Breast engorgement, postpartum    ED Discharge Orders     None          Jerrol Agent, MD 09/21/24 1636  "

## 2024-09-21 NOTE — ED Notes (Signed)
 Manual breast pump provided to pt and shown how to use. Pt verbalized understanding. Attempting to express from Left breast to relieve pain and discomfort.
